# Patient Record
Sex: Female | Born: 1955
Health system: Southern US, Community
[De-identification: ages and names within clinical notes are randomized; demographics above are authoritative.]

## PROBLEM LIST (undated history)

## (undated) DIAGNOSIS — F329 Major depressive disorder, single episode, unspecified: Secondary | ICD-10-CM

## (undated) DIAGNOSIS — M7989 Other specified soft tissue disorders: Secondary | ICD-10-CM

## (undated) DIAGNOSIS — T7840XA Allergy, unspecified, initial encounter: Secondary | ICD-10-CM

## (undated) DIAGNOSIS — M1990 Unspecified osteoarthritis, unspecified site: Secondary | ICD-10-CM

## (undated) DIAGNOSIS — F419 Anxiety disorder, unspecified: Secondary | ICD-10-CM

## (undated) DIAGNOSIS — G4730 Sleep apnea, unspecified: Secondary | ICD-10-CM

## (undated) DIAGNOSIS — I38 Endocarditis, valve unspecified: Secondary | ICD-10-CM

## (undated) DIAGNOSIS — M25561 Pain in right knee: Secondary | ICD-10-CM

## (undated) DIAGNOSIS — E039 Hypothyroidism, unspecified: Secondary | ICD-10-CM

## (undated) DIAGNOSIS — I10 Essential (primary) hypertension: Secondary | ICD-10-CM

## (undated) DIAGNOSIS — R002 Palpitations: Secondary | ICD-10-CM

## (undated) DIAGNOSIS — F32A Depression, unspecified: Secondary | ICD-10-CM

## (undated) HISTORY — DX: Allergy, unspecified, initial encounter: T78.40XA

## (undated) HISTORY — DX: Essential (primary) hypertension: I10

## (undated) HISTORY — DX: Major depressive disorder, single episode, unspecified: F32.9

## (undated) HISTORY — DX: Endocarditis, valve unspecified: I38

## (undated) HISTORY — DX: Other specified soft tissue disorders: M79.89

## (undated) HISTORY — DX: Pain in right knee: M25.561

## (undated) HISTORY — DX: Anxiety disorder, unspecified: F41.9

## (undated) HISTORY — DX: Palpitations: R00.2

## (undated) HISTORY — DX: Hypothyroidism, unspecified: E03.9

## (undated) HISTORY — DX: Sleep apnea, unspecified: G47.30

## (undated) HISTORY — DX: Depression, unspecified: F32.A

## (undated) HISTORY — DX: Unspecified osteoarthritis, unspecified site: M19.90

---

## 2015-03-07 ENCOUNTER — Other Ambulatory Visit: Payer: Self-pay | Admitting: Family Medicine

## 2015-03-07 DIAGNOSIS — Z1231 Encounter for screening mammogram for malignant neoplasm of breast: Secondary | ICD-10-CM

## 2015-03-19 ENCOUNTER — Ambulatory Visit
Admission: RE | Admit: 2015-03-19 | Discharge: 2015-03-19 | Disposition: A | Discharge status: Home / Self Care | Payer: 59 | Source: Ambulatory Visit | Attending: Family Medicine | Admitting: Family Medicine

## 2015-03-19 DIAGNOSIS — Z1231 Encounter for screening mammogram for malignant neoplasm of breast: Secondary | ICD-10-CM

## 2016-03-02 DIAGNOSIS — Z23 Encounter for immunization: Secondary | ICD-10-CM | POA: Diagnosis not present

## 2016-03-02 DIAGNOSIS — I10 Essential (primary) hypertension: Secondary | ICD-10-CM | POA: Diagnosis not present

## 2016-03-02 DIAGNOSIS — E039 Hypothyroidism, unspecified: Secondary | ICD-10-CM | POA: Diagnosis not present

## 2016-04-08 DIAGNOSIS — Z0000 Encounter for general adult medical examination without abnormal findings: Secondary | ICD-10-CM | POA: Diagnosis not present

## 2016-04-08 DIAGNOSIS — R7301 Impaired fasting glucose: Secondary | ICD-10-CM | POA: Diagnosis not present

## 2016-04-16 ENCOUNTER — Other Ambulatory Visit: Payer: Self-pay | Admitting: Family Medicine

## 2016-04-16 DIAGNOSIS — Z1231 Encounter for screening mammogram for malignant neoplasm of breast: Secondary | ICD-10-CM

## 2016-04-16 DIAGNOSIS — Z0000 Encounter for general adult medical examination without abnormal findings: Secondary | ICD-10-CM | POA: Diagnosis not present

## 2016-04-16 DIAGNOSIS — Z01419 Encounter for gynecological examination (general) (routine) without abnormal findings: Secondary | ICD-10-CM | POA: Diagnosis not present

## 2016-05-05 ENCOUNTER — Ambulatory Visit
Admission: RE | Admit: 2016-05-05 | Discharge: 2016-05-05 | Disposition: A | Discharge status: Home / Self Care | Payer: 59 | Source: Ambulatory Visit | Attending: Family Medicine | Admitting: Family Medicine

## 2016-05-05 DIAGNOSIS — Z1231 Encounter for screening mammogram for malignant neoplasm of breast: Secondary | ICD-10-CM | POA: Diagnosis not present

## 2016-06-09 DIAGNOSIS — K5900 Constipation, unspecified: Secondary | ICD-10-CM | POA: Diagnosis not present

## 2016-06-09 DIAGNOSIS — K649 Unspecified hemorrhoids: Secondary | ICD-10-CM | POA: Diagnosis not present

## 2016-06-16 DIAGNOSIS — Z23 Encounter for immunization: Secondary | ICD-10-CM | POA: Diagnosis not present

## 2016-06-16 DIAGNOSIS — K648 Other hemorrhoids: Secondary | ICD-10-CM | POA: Diagnosis not present

## 2016-06-16 DIAGNOSIS — I10 Essential (primary) hypertension: Secondary | ICD-10-CM | POA: Diagnosis not present

## 2016-06-16 DIAGNOSIS — K5900 Constipation, unspecified: Secondary | ICD-10-CM | POA: Diagnosis not present

## 2016-06-25 ENCOUNTER — Encounter: Payer: Self-pay | Admitting: Gastroenterology

## 2016-07-06 DIAGNOSIS — K5900 Constipation, unspecified: Secondary | ICD-10-CM | POA: Diagnosis not present

## 2016-07-06 DIAGNOSIS — K649 Unspecified hemorrhoids: Secondary | ICD-10-CM | POA: Diagnosis not present

## 2016-07-09 ENCOUNTER — Encounter: Payer: Self-pay | Admitting: Nurse Practitioner

## 2016-07-09 ENCOUNTER — Ambulatory Visit (INDEPENDENT_AMBULATORY_CARE_PROVIDER_SITE_OTHER): Payer: 59 | Admitting: Nurse Practitioner

## 2016-07-09 VITALS — BP 124/68 | HR 115 | Ht 63.0 in | Wt 206.0 lb

## 2016-07-09 DIAGNOSIS — K602 Anal fissure, unspecified: Secondary | ICD-10-CM

## 2016-07-09 MED ORDER — LIDOCAINE HCL 2 % EX GEL: 1.0000 "application " | Freq: Three times a day (TID) | CUTANEOUS | 1 refills | Status: DC

## 2016-07-09 MED ORDER — DILTIAZEM GEL 2 %: 1.0000 "application " | Freq: Three times a day (TID) | CUTANEOUS | 1 refills | Status: DC

## 2016-07-09 NOTE — Progress Notes (Signed)
HPI: Patient is 61 year old female referred by PCP Maryelizabeth RowanElizabeth Dewey for evaluation of rectal pain. Patient gives a very long history of hemorrhoids but over the last 6 weeks she has developed severe rectal pain as well as bleeding with bowel movements. She isn't usually constipated but strained with a BM several weeks ago.  She used a week of hemorrhoidal cream which helped some.. No abdominal pain. She [place] in [place]. No other GI or general medical complaints   Past Medical History:  Diagnosis Date  . Hypertension   . Hypothyroidism     History reviewed. No pertinent surgical history. Family History  Problem Relation Age of Onset  . Diabetes Mother   . Colon cancer Maternal Uncle    Social History  Substance Use Topics  . Smoking status: Never Smoker  . Smokeless tobacco: Never Used  . Alcohol use 0.6 oz/week    1 Glasses of wine per week   Current Outpatient Prescriptions  Medication Sig Dispense Refill  . azelastine (OPTIVAR) 0.05 % ophthalmic solution 1 drop 2 (two) times daily.    . Azelastine HCl 0.15 % SOLN Place into the nose. 2 sprays intranasally 2 times per day in each nostril    . Cholecalciferol (VITAMIN D3) 5000 units CAPS Take 1 capsule by mouth daily.    Marland Kitchen. docusate sodium (COLACE) 100 MG capsule Take 100 mg by mouth 2 (two) times daily as needed for mild constipation.    . Estradiol (VAGIFEM) 10 MCG TABS vaginal tablet Place 1 tablet vaginally daily.    . fluticasone (VERAMYST) 27.5 MCG/SPRAY nasal spray Place 2 sprays into the nose daily.    Marland Kitchen. HYDROCORTISONE ACE, RECTAL, 30 MG SUPP Place rectally daily.    Marland Kitchen. levothyroxine (SYNTHROID, LEVOTHROID) 50 MCG tablet Take 50 mcg by mouth daily before breakfast.    . loratadine (CLARITIN) 10 MG tablet Take 10 mg by mouth daily.    Marland Kitchen. sulfacetamide-prednisolone (BLEPHAMIDE) 10-0.2 % ophthalmic suspension Place 1 drop into both eyes 4 (four) times daily.     No current  facility-administered medications for this visit.    Allergies  Allergen Reactions  . Latex   . Sulfa Antibiotics Swelling  . Penicillins Rash     Review of Systems: All systems reviewed and negative except where noted in HPI.    Physical Exam: BP 124/68 (BP Location: Right Arm, Patient Position: Sitting, Cuff Size: Normal)   Pulse (!) 115   Ht 5\' 3"  (1.6 m)   Wt 206 lb (93.4 kg)   BMI 36.49 kg/m  Constitutional:  Overweight white female in no acute distress. Psychiatric:very pleasant, normal mood and affect. Behavior is normal. EENT: Pupils normal.  Conjunctivae are normal. No scleral icterus. Neck supple.  Cardiovascular: Normal rate, regular rhythm. No edema Pulmonary/chest: Effort normal and breath sounds normal. No wheezing, rales or rhonchi. Abdominal: Soft, nondistended. Nontender. Bowel sounds active throughout. There are no masses palpable. No hepatomegaly RECTAL:  Fleshy hemorrhoidal tags. Fairly deep posterior midline fissure seen. DRE not attempted.  Lymphadenopathy: No cervical adenopathy noted. Neurological: Alert and oriented to person place and time. Skin: Skin is warm and dry. No rashes noted.   ASSESSMENT AND PLAN:  1. Pleasant 61 yo female with 6 week history of severe rectal pain and bleeding following and episode of straining (unusal for her). She has a fairly deep posterior midline fissure on exam.  -Diltiazem gel TID for 6-8 weeks -topical lidocaine TID prn -continue stool  softener. Use glycerin supp prn -follow up with me in 6-8 weeks, or sooner if not improving.  -Will request colonoscopy report from [place]  2. Colon cancer screening. She reports normal colonoscopy in 2014 in [place]. Will request results.  Will put her on colon recall list once result reviewed.   Willette Cluster, NP  07/09/2016, 11:33 AM  Cc:  Lewis Moccasin, MD

## 2016-07-09 NOTE — Patient Instructions (Addendum)
If you are age 62 or older, your body mass index should be bet14ween 23-30. Your Body mass index is 36.49 kg/m. If this is out of the aforementioned range listed, please consider follow up with your Primary Care Provider.  If you are age 62 or younger, your body mass index should be between 19-25. Your Body mass index is 36.49 kg/m. If this is out of the aformentioned range listed, please consider follow up with your Primary Care Provider.   We have sent the following medications to your pharmacy for you to pick up at your convenience: Diltiazem Gel Lidocaine Gel  Use Glycerin suppositories as needed.   How to Take a Sitz Bath A sitz bath is a warm water bath that is taken while you are sitting down. The water should only come up to your hips and should cover your buttocks. Your health care provider may recommend a sitz bath to help you:  Clean the lower part of your body, including your genital area.  With itching.  With pain.  With sore muscles or muscles that tighten or spasm.  How to take a sitz bath Take 3 sitz baths per day or as told by your health care provider. 1. Partially fill a bathtub with warm water. You will only need the water to be deep enough to cover your hips and buttocks when you are sitting in it. 2. If your health care provider told you to put medicine in the water, follow the directions exactly. 3. Sit in the water and open the tub drain a little. 4. Turn on the warm water again to keep the tub at the correct level. Keep the water running constantly. 5. Soak in the water for 15-20 minutes or as told by your health care provider. 6. After the sitz bath, pat the affected area dry first. Do not rub it. 7. Be careful when you stand up after the sitz bath because you may feel dizzy.  Contact a health care provider if:  Your symptoms get worse. Do not continue with sitz baths if your symptoms get worse.  You have new symptoms. Do not continue with sitz baths  until you talk with your health care provider. This information is not intended to replace advice given to you by your health care provider. Make sure you discuss any questions you have with your health care provider. Document Released: 09/14/2003 Document Revised: 05/22/2015 Document Reviewed: 12/20/2013 Elsevier Interactive Patient Education  [employer]2018 [employer].  Call with an update in three weeks.  Set up an appointment with Willette ClusterPaula Guenther, NP in 4-6 weeks.  Thank you for choosing me and [facility].   Willette ClusterPaula Guenther, NP

## 2016-07-13 NOTE — Progress Notes (Signed)
Agree with assessment and plan as outlined.  

## 2016-07-21 DIAGNOSIS — H43813 Vitreous degeneration, bilateral: Secondary | ICD-10-CM | POA: Diagnosis not present

## 2016-08-06 ENCOUNTER — Ambulatory Visit: Payer: 59 | Admitting: Gastroenterology

## 2016-08-18 ENCOUNTER — Encounter (INDEPENDENT_AMBULATORY_CARE_PROVIDER_SITE_OTHER): Payer: Self-pay

## 2016-08-18 ENCOUNTER — Encounter: Payer: Self-pay | Admitting: Nurse Practitioner

## 2016-08-18 ENCOUNTER — Ambulatory Visit (INDEPENDENT_AMBULATORY_CARE_PROVIDER_SITE_OTHER): Payer: 59 | Admitting: Nurse Practitioner

## 2016-08-18 VITALS — BP 140/60 | HR 100 | Ht 62.25 in | Wt 208.0 lb

## 2016-08-18 DIAGNOSIS — K602 Anal fissure, unspecified: Secondary | ICD-10-CM | POA: Diagnosis not present

## 2016-08-18 NOTE — Progress Notes (Signed)
     HPI: Patient is a 61 year old female who I saw approximately one month ago with severe rectal pain. A deep posterior midline fissure was found on exam. She is back for follow-up and has had great improvement with diltiazem gel. No rectal bleeding, able to sit without significant discomfort now. No significant pain with defecation. Her stools are soft, doesn't strain. Everything is going well. No abdominal pain. Leaving for vacation soon and a little nervous about managing bowels / fissure while away.    Past Medical History:  Diagnosis Date  . Hypertension   . Hypothyroidism     Patient's surgical history, family medical history, social history, medications and allergies were all reviewed in Epic    Physical Exam: BP 140/60 (BP Location: Left Arm, Patient Position: Sitting, Cuff Size: Normal)   Pulse 100   Ht 5' 2.25" (1.581 m) Comment: height measured without shoes  Wt 208 lb (94.3 kg)   BMI 37.74 kg/m   GENERAL: well developed white female in NAD PSYCH: :Pleasant, cooperative, normal affect NEURO: Alert and oriented x 3, no focal neurologic deficits Rectal: posterior midline fissure filling in with granulation tissue   ASSESSMENT and PLAN:  1. Pleasant 61 year old female with deep posterior midline fissure. Healing nicely on 5th week of diltiazem gel.  -continue Diltiazem gel TID for another 3-4 weeks. Refill given.  -follow up with me in 4 weeks -keep stools soft, do not strain to have a BM. Takes stool softeners. She will keep glycerin supp on hand in case of constipation while on vacation.   2. Colon cancer screening. Received colonoscopy report requested at time of her last visit. [facility]. The exam was complete with a good prep. Multiple small mouth diverticula were found in the sigmoid colon. Internal hemorrhoids seen. Exam otherwise negative. Follow-up colonoscopy recommended at 10 years which would put her at  April 2024   Willette ClusterPaula Guenther , NP 08/18/2016, 11:23 AM

## 2016-08-18 NOTE — Patient Instructions (Addendum)
If you are age 62 or older, your body mass index should be between 23-30. Your Body mass index is 37.74 kg/m. If this is out of the aforementioned range listed, please consider follow up with your Primary Care Provider.  If you are age 62 or younger, your body mass index should be between 19-25. Your Body mass index is 37.74 kg/m. If this is out of the aformentioned range listed, please consider follow up with your Primary Care Provider.   Continue anal fissure treatment.  Follow up in four weeks with Willette ClusterPaula Guenther, NP. Please call the office to set up an appointment in four weeks as the schedule is not out.  Thank you for choosing me and  Gastro[facility].   Willette ClusterPaula Guenther, NP

## 2016-08-19 NOTE — Progress Notes (Signed)
Agree with assessment and plan as outlined.  

## 2016-10-16 ENCOUNTER — Ambulatory Visit (INDEPENDENT_AMBULATORY_CARE_PROVIDER_SITE_OTHER): Payer: 59 | Admitting: Family Medicine

## 2016-10-16 ENCOUNTER — Encounter: Payer: Self-pay | Admitting: Family Medicine

## 2016-10-16 VITALS — BP 136/82 | HR 110 | Temp 98.5°F | Ht 62.25 in | Wt 210.4 lb

## 2016-10-16 DIAGNOSIS — E039 Hypothyroidism, unspecified: Secondary | ICD-10-CM | POA: Diagnosis not present

## 2016-10-16 DIAGNOSIS — E669 Obesity, unspecified: Secondary | ICD-10-CM

## 2016-10-16 DIAGNOSIS — I10 Essential (primary) hypertension: Secondary | ICD-10-CM

## 2016-10-16 DIAGNOSIS — Z23 Encounter for immunization: Secondary | ICD-10-CM | POA: Diagnosis not present

## 2016-10-16 DIAGNOSIS — Z0000 Encounter for general adult medical examination without abnormal findings: Secondary | ICD-10-CM

## 2016-10-16 MED ORDER — LISINOPRIL 10 MG PO TABS: 10.0000 mg | ORAL_TABLET | Freq: Every day | ORAL | 3 refills | Status: DC

## 2016-10-16 NOTE — Patient Instructions (Signed)
Look at [place].

## 2016-10-16 NOTE — Progress Notes (Signed)
Kimberly Mcguire is a 61 y.o. female is here to [facility] CARE.   Patient Care Team: [name], DO as PCP - General (Family Medicine)   History of Present Illness:   Kimberly Mcguire, CMA, acting as scribe for Dr. Earlene Plater.  HPI:   1. Routine physical examination.  The patient is here to establish care. She reports no new symptoms today.    2. Acquired hypothyroidism. Long history of hypothyroidism for which she takes levothyroxine. He is due for a recheck of her hands. She denies overt fatigue, but does endorse some weight gain.    3. Essential hypertension.  Currently controlled with lisinopril. She denies any cough or other side effects. She does not check her blood pressure regularly. Due for labs.    4. Need for immunization against influenza. She agrees to the flu shot today.    5. Obesity (BMI 30-39.9). Steady weight gain per the patient. She would like to see if her thyroid has anything to do with this. She does exercise but not regularly. She follows no restrictive diets.    Health Maintenance Due  Topic Date Due  . Hepatitis C Screening  05-Sep-1955  . HIV Screening  03/01/1969   Depression screen PHQ 2/9 10/16/2016  Decreased Interest 0  Down, Depressed, Hopeless 0  PHQ - 2 Score 0   PMHx, SurgHx, SocialHx, Medications, and Allergies were reviewed in the Visit Navigator and updated as appropriate.   Past Medical History:  Diagnosis Date  . Hypertension   . Hypothyroidism    History reviewed. No pertinent surgical history.  Family History  Problem Relation Age of Onset  . Diabetes Mother   . Colon cancer Maternal Uncle    Social History  Substance Use Topics  . Smoking status: Never Smoker  . Smokeless tobacco: Never Used  . Alcohol use 0.6 oz/week    1 Glasses of wine per week   Current Medications and Allergies:   .  Azelastine HCl 0.15 % SOLN, Place into the nose. 2 sprays intranasally 2 times per day in each nostril, Disp: , Rfl:  .  Cholecalciferol  (VITAMIN D3) 5000 units CAPS, Take 1 capsule by mouth daily., Disp: , Rfl:  .  docusate sodium (COLACE) 100 MG capsule, Take 100 mg by mouth 2 (two) times daily as needed for mild constipation., Disp: , Rfl:  .  Estradiol (VAGIFEM) 10 MCG TABS vaginal tablet, Place 1 tablet vaginally once a week. , Disp: , Rfl:  .  fluticasone (VERAMYST) 27.5 MCG/SPRAY nasal spray, Place 2 sprays into the nose daily., Disp: , Rfl:  .  levothyroxine (SYNTHROID, LEVOTHROID) 50 MCG tablet, Take 50 mcg by mouth daily before breakfast., Disp: , Rfl:  .  loratadine (CLARITIN) 10 MG tablet, Take 10 mg by mouth daily., Disp: , Rfl:  .  lisinopril (PRINIVIL) 10 MG tablet, Take 1 tablet (10 mg total) by mouth daily., Disp: 90 tablet, Rfl: 3  Allergies  Allergen Reactions  . Latex   . Sulfa Antibiotics Swelling  . Penicillins Rash   Review of Systems:   Pertinent items are noted in the HPI. Otherwise, ROS is negative.  Vitals:   Vitals:   10/16/16 1406  BP: 136/82  Pulse: (!) 110  Temp: 98.5 F (36.9 C)  TempSrc: Oral  SpO2: 98%  Weight: 210 lb 6.4 oz (95.4 kg)  Height: 5' 2.25" (1.581 m)     Body mass index is 38.17 kg/m.   Physical Exam:   Physical Exam  Constitutional: She is oriented to person, place, and time. She appears well-developed and well-nourished. No distress.  HENT:  Head: Normocephalic and atraumatic.  Right Ear: External ear normal.  Left Ear: External ear normal.  Nose: Nose normal.  Mouth/Throat: Oropharynx is clear and moist.  Eyes: Pupils are equal, round, and reactive to light. Conjunctivae and EOM are normal.  Neck: Normal range of motion. Neck supple. No thyromegaly present.  Cardiovascular: Normal rate, regular rhythm, normal heart sounds and intact distal pulses.   Pulmonary/Chest: Effort normal and breath sounds normal.  Abdominal: Soft. Bowel sounds are normal.  Musculoskeletal: Normal range of motion.  Lymphadenopathy:    She has no cervical adenopathy.    Neurological: She is alert and oriented to person, place, and time.  Skin: Skin is warm and dry. Capillary refill takes less than 2 seconds.  Psychiatric: She has a normal mood and affect. Her behavior is normal.  Nursing note and vitals reviewed.  Assessment and Plan:   Kimberly Mcguire was seen today for establish care.  Diagnoses and all orders for this visit:  Routine physical examination  Acquired hypothyroidism -     TSH; Future -     T4, free; Future  Essential hypertension -     lisinopril (PRINIVIL) 10 MG tablet; Take 1 tablet (10 mg total) by mouth daily. -     Comprehensive metabolic panel; Future  Need for immunization against influenza -     Flu Vaccine QUAD 36+ mos IM  Obesity (BMI 30-39.9) Comments: The patient is asked to make an attempt to improve diet and exercise patterns to aid in medical management of this problem.  Orders: -     Insulin, Free (Bioactive); Future  . Reviewed expectations re: course of current medical issues. . Discussed self-management of symptoms. . Outlined signs and symptoms indicating need for more acute intervention. . Patient verbalized understanding and all questions were answered. Marland Kitchen Health Maintenance issues including appropriate healthy diet, exercise, and smoking avoidance were discussed with patient. . See orders for this visit as documented in the electronic medical record. . Patient received an After Visit Summary.  Patient Counseling:    Nutrition: Stressed importance of moderation in sodium/caffeine intake, saturated fat and cholesterol, caloric balance, sufficient intake of fresh fruits, vegetables, fiber, calcium, iron, and 1 mg of folate supplement per day (for females capable of pregnancy).     Stressed the importance of regular exercise.      Substance Abuse: Discussed cessation/primary prevention of tobacco, alcohol, or other drug use; driving or other dangerous activities under the influence; availability of  treatment for abuse.      Injury prevention: Discussed safety belts, safety helmets, smoke detector, smoking near bedding or upholstery.      Sexuality: Discussed sexually transmitted diseases, partner selection, use of condoms, avoidance of unintended pregnancy  and contraceptive alternatives.     Dental health: Discussed importance of regular tooth brushing, flossing, and dental visits.     Health maintenance and immunizations reviewed. Please refer to Health maintenance section.   CMA served as Neurosurgeon during this visit. History, Physical, and Plan performed by medical provider. The above documentation has been reviewed and is accurate and complete. [name], D.O.  [name], DO [facility] 10/18/2016  Future Appointments Date Time Provider Department Center  10/19/2016 10:00 AM Meredith Pel, NP [facility]  10/20/2016 9:15 AM [facility] None

## 2016-10-18 DIAGNOSIS — E039 Hypothyroidism, unspecified: Secondary | ICD-10-CM | POA: Insufficient documentation

## 2016-10-18 DIAGNOSIS — E669 Obesity, unspecified: Secondary | ICD-10-CM | POA: Insufficient documentation

## 2016-10-18 DIAGNOSIS — Z780 Asymptomatic menopausal state: Secondary | ICD-10-CM | POA: Insufficient documentation

## 2016-10-18 DIAGNOSIS — I10 Essential (primary) hypertension: Secondary | ICD-10-CM | POA: Insufficient documentation

## 2016-10-19 ENCOUNTER — Encounter: Payer: Self-pay | Admitting: Nurse Practitioner

## 2016-10-19 ENCOUNTER — Ambulatory Visit (INDEPENDENT_AMBULATORY_CARE_PROVIDER_SITE_OTHER): Payer: 59 | Admitting: Nurse Practitioner

## 2016-10-19 VITALS — BP 122/72 | HR 80 | Ht 62.0 in | Wt 209.0 lb

## 2016-10-19 DIAGNOSIS — K602 Anal fissure, unspecified: Secondary | ICD-10-CM

## 2016-10-19 NOTE — Patient Instructions (Signed)
If you are age 62 or older, your body mass index should be between 23-30. Your Body mass index is 38.23 kg/m. If this is out of the aforementioned range listed, please consider follow up with your Primary Care Provider.  If you are age 50 or younger, your body mass index should be between 19-25. Your Body mass index is 38.23 kg/m. If this is out of the aformentioned range listed, please consider follow up with your Primary Care Provider.   Follow up as needed.  Thank you for choosing me and [facility].   Willette Cluster, NP

## 2016-10-19 NOTE — Progress Notes (Signed)
     Chief Complaint:  Follow up on anal fissure    HPI: Patient is a 61 yo female who I saw in July for severe anal pain and found a fissure on exam. Started Diltizem gel and she had significantly improved on follow up mid August. She is back today for a final follow up. She has no anorectal pain. No rectal bleeding. Her bowels are moving well on a high fiber diet. She asks about adding Mg+ to be sure she never get constipated.    Past Medical History:  Diagnosis Date  . Hypertension   . Hypothyroidism     Patient's surgical history, family medical history, social history, medications and allergies were all reviewed in Epic    Physical Exam: BP 122/72   Pulse 80   Ht  (1.575 m)   Wt 209 lb (94.8 kg)   BMI 38.23 kg/m   GENERAL:  Pleasant white female in NAD PSYCH: :Pleasant, cooperative, normal affect PULM: Normal respiratory effort NEURO: Alert and oriented x 3, no focal neurologic deficits Rectal : no external lesions seen. Fissure healed   ASSESSMENT and PLAN:  1. Pleasant 61 year old female with recent anal fissure, resolved with Diltiazem.  -Patient eats a diet high in fiber, she has never really had problems with constipation but inquires about additng magnesium to be sure that she never does have problems.  I advised stool softeners and continuation of high fiber diet instead of adding magnesium at this point. She now knows to avoid straining and has used a glycerin supp a couple of times which worked well.  -follow up prn.   2. Colon cancer screening. [place].Records reviewed at last visit, due again 2024  Willette Cluster , NP 10/19/2016, 10:15 AM

## 2016-10-20 ENCOUNTER — Other Ambulatory Visit (INDEPENDENT_AMBULATORY_CARE_PROVIDER_SITE_OTHER): Payer: 59

## 2016-10-20 DIAGNOSIS — E669 Obesity, unspecified: Secondary | ICD-10-CM

## 2016-10-20 DIAGNOSIS — E039 Hypothyroidism, unspecified: Secondary | ICD-10-CM

## 2016-10-20 DIAGNOSIS — I10 Essential (primary) hypertension: Secondary | ICD-10-CM

## 2016-10-20 LAB — TSH: TSH: 2.34 u[IU]/mL (ref 0.35–4.50)

## 2016-10-20 LAB — COMPREHENSIVE METABOLIC PANEL
ALT: 24 U/L (ref 0–35)
AST: 16 U/L (ref 0–37)
Albumin: 4.5 g/dL (ref 3.5–5.2)
Alkaline Phosphatase: 46 U/L (ref 39–117)
BUN: 16 mg/dL (ref 6–23)
CO2: 28 mEq/L (ref 19–32)
Calcium: 10.1 mg/dL (ref 8.4–10.5)
Chloride: 100 mEq/L (ref 96–112)
Creatinine, Ser: 0.67 mg/dL (ref 0.40–1.20)
GFR: 94.6 mL/min (ref >60.00)
Glucose, Bld: 103 mg/dL — ABNORMAL HIGH (ref 70–99)
Potassium: 4.8 mEq/L (ref 3.5–5.1)
Sodium: 139 mEq/L (ref 135–145)
Total Bilirubin: 0.6 mg/dL (ref 0.2–1.2)
Total Protein: 7.1 g/dL (ref 6.0–8.3)

## 2016-10-20 LAB — T4, FREE: Free T4: 0.93 ng/dL (ref 0.60–1.60)

## 2016-10-20 NOTE — Progress Notes (Signed)
Agree with assessment and plan as outlined.  

## 2016-10-23 LAB — INSULIN, FREE (BIOACTIVE): Insulin, Free: 10.5 u[IU]/mL (ref 1.5–14.9)

## 2017-01-07 ENCOUNTER — Other Ambulatory Visit: Payer: Self-pay | Admitting: Family Medicine

## 2017-01-08 NOTE — Telephone Encounter (Signed)
Please advise on refill.

## 2017-01-18 ENCOUNTER — Ambulatory Visit: Payer: 59 | Admitting: Family Medicine

## 2017-01-18 DIAGNOSIS — Z0289 Encounter for other administrative examinations: Secondary | ICD-10-CM

## 2017-01-21 ENCOUNTER — Other Ambulatory Visit: Payer: Self-pay

## 2017-01-21 MED ORDER — AZELASTINE HCL 0.15 % NA SOLN: 2.0000 | Freq: Two times a day (BID) | NASAL | 3 refills | Status: DC

## 2017-02-15 ENCOUNTER — Encounter: Payer: Self-pay | Admitting: Family Medicine

## 2017-02-26 DIAGNOSIS — L600 Ingrowing nail: Secondary | ICD-10-CM | POA: Diagnosis not present

## 2017-02-26 DIAGNOSIS — M25775 Osteophyte, left foot: Secondary | ICD-10-CM | POA: Diagnosis not present

## 2017-04-19 ENCOUNTER — Ambulatory Visit (INDEPENDENT_AMBULATORY_CARE_PROVIDER_SITE_OTHER): Payer: 59 | Admitting: Family Medicine

## 2017-04-19 ENCOUNTER — Other Ambulatory Visit: Payer: Self-pay | Admitting: Family Medicine

## 2017-04-19 ENCOUNTER — Encounter: Payer: Self-pay | Admitting: Family Medicine

## 2017-04-19 VITALS — BP 134/80 | HR 90 | Temp 98.3°F | Ht 62.0 in | Wt 213.8 lb

## 2017-04-19 DIAGNOSIS — I10 Essential (primary) hypertension: Secondary | ICD-10-CM

## 2017-04-19 DIAGNOSIS — E039 Hypothyroidism, unspecified: Secondary | ICD-10-CM | POA: Diagnosis not present

## 2017-04-19 DIAGNOSIS — R5381 Other malaise: Secondary | ICD-10-CM

## 2017-04-19 DIAGNOSIS — Z1159 Encounter for screening for other viral diseases: Secondary | ICD-10-CM | POA: Diagnosis not present

## 2017-04-19 DIAGNOSIS — R5383 Other fatigue: Secondary | ICD-10-CM | POA: Diagnosis not present

## 2017-04-19 DIAGNOSIS — Z114 Encounter for screening for human immunodeficiency virus [HIV]: Secondary | ICD-10-CM

## 2017-04-19 DIAGNOSIS — E559 Vitamin D deficiency, unspecified: Secondary | ICD-10-CM | POA: Diagnosis not present

## 2017-04-19 LAB — CBC WITH DIFFERENTIAL/PLATELET
Basophils Absolute: 0 10*3/uL (ref 0.0–0.1)
Basophils Relative: 1 % (ref 0.0–3.0)
Eosinophils Absolute: 0.1 10*3/uL (ref 0.0–0.7)
Eosinophils Relative: 2.7 % (ref 0.0–5.0)
HCT: 42.7 % (ref 36.0–46.0)
Hemoglobin: 14.9 g/dL (ref 12.0–15.0)
Lymphocytes Relative: 37.8 % (ref 12.0–46.0)
Lymphs Abs: 2 10*3/uL (ref 0.7–4.0)
MCHC: 34.9 g/dL (ref 30.0–36.0)
MCV: 97.2 fl (ref 78.0–100.0)
Monocytes Absolute: 0.4 10*3/uL (ref 0.1–1.0)
Monocytes Relative: 7.2 % (ref 3.0–12.0)
Neutro Abs: 2.7 10*3/uL (ref 1.4–7.7)
Neutrophils Relative %: 51.3 % (ref 43.0–77.0)
Platelets: 244 10*3/uL (ref 150.0–400.0)
RBC: 4.39 Mil/uL (ref 3.87–5.11)
RDW: 13.1 % (ref 11.5–15.5)
WBC: 5.2 10*3/uL (ref 4.0–10.5)

## 2017-04-19 LAB — VITAMIN D 25 HYDROXY (VIT D DEFICIENCY, FRACTURES): VITD: 42.56 ng/mL (ref 30.00–100.00)

## 2017-04-19 LAB — COMPREHENSIVE METABOLIC PANEL
ALT: 27 U/L (ref 0–35)
AST: 17 U/L (ref 0–37)
Albumin: 4.5 g/dL (ref 3.5–5.2)
Alkaline Phosphatase: 47 U/L (ref 39–117)
BUN: 15 mg/dL (ref 6–23)
CO2: 29 mEq/L (ref 19–32)
Calcium: 9.8 mg/dL (ref 8.4–10.5)
Chloride: 101 mEq/L (ref 96–112)
Creatinine, Ser: 0.56 mg/dL (ref 0.40–1.20)
GFR: 116.16 mL/min (ref >60.00)
Glucose, Bld: 96 mg/dL (ref 70–99)
Potassium: 4.3 mEq/L (ref 3.5–5.1)
Sodium: 137 mEq/L (ref 135–145)
Total Bilirubin: 0.7 mg/dL (ref 0.2–1.2)
Total Protein: 7.1 g/dL (ref 6.0–8.3)

## 2017-04-19 LAB — T4, FREE: Free T4: 0.92 ng/dL (ref 0.60–1.60)

## 2017-04-19 LAB — TSH: TSH: 2.38 u[IU]/mL (ref 0.35–4.50)

## 2017-04-19 LAB — T3, FREE: T3, Free: 3.5 pg/mL (ref 2.3–4.2)

## 2017-04-19 MED ORDER — LISINOPRIL 10 MG PO TABS: 10.0000 mg | ORAL_TABLET | Freq: Every day | ORAL | 3 refills | Status: DC

## 2017-04-19 MED ORDER — LEVOTHYROXINE SODIUM 50 MCG PO TABS: 50.0000 ug | ORAL_TABLET | Freq: Every day | ORAL | 1 refills | Status: DC

## 2017-04-19 NOTE — Progress Notes (Signed)
Kimberly ModeJoanne Stitely is a 62 y.o. female is here for follow up.  History of Present Illness:   HPI:   1. Acquired hypothyroidism.  Due for recheck.  Patient denies any overt malaise or weight changes.  She does not complain of any hair loss or dry skin.   2. Essential hypertension.  Compliant with treatment.  No concerns.  Denies chest pain, shortness of breath, edema.   3. Vitamin D deficiency.  Taking vitamin D 5000 international units daily.  Due for recheck.   4. Malaise and fatigue.  Daily.  She also endorses not sleeping very well.  She does wake multiple times per night and has been told by her husband that she makes funny noises.  She would be open to a sleep study.   Health Maintenance Due  Topic Date Due  . Hepatitis C Screening  1955-08-01  . HIV Screening  03/01/1969   [facility] 2/9 04/19/2017 10/16/2016  Decreased Interest 0 0  Down, Depressed, Hopeless 0 0  PHQ - 2 Score 0 0  Altered sleeping 1 -  Tired, decreased energy 1 -  Change in appetite 0 -  Feeling bad or failure about yourself  0 -  Trouble concentrating 0 -  Moving slowly or fidgety/restless 0 -  Suicidal thoughts 0 -  PHQ-9 Score 2 -  Difficult doing work/chores Not difficult at all -   PMHx, SurgHx, SocialHx, FamHx, Medications, and Allergies were reviewed in the Visit Navigator and updated as appropriate.   Patient Active Problem List   Diagnosis Date Noted  . Postmenopausal 10/18/2016  . Obesity (BMI 30-39.9) 10/18/2016  . Essential hypertension 10/18/2016  . Acquired hypothyroidism 10/18/2016   Social History   Tobacco Use  . Smoking status: Never Smoker  . Smokeless tobacco: Never Used  Substance Use Topics  . Alcohol use: Yes    Alcohol/week: 0.6 oz    Types: 1 Glasses of wine per week  . Drug use: No   Current Medications and Allergies:   Current Outpatient Medications:  .  acyclovir ointment (ZOVIRAX) 5 %, Apply 1 application topically every 3 (three) hours., Disp: ,  Rfl:  .  Cholecalciferol (VITAMIN D3) 5000 units CAPS, Take 1 capsule by mouth daily., Disp: , Rfl:  .  Estradiol (VAGIFEM) 10 MCG TABS vaginal tablet, Place 1 tablet vaginally once a week. , Disp: , Rfl:  .  fluticasone (VERAMYST) 27.5 MCG/SPRAY nasal spray, Place 2 sprays into the nose daily., Disp: , Rfl:  .  levothyroxine (SYNTHROID, LEVOTHROID) 50 MCG tablet, Take 1 tablet (50 mcg total) by mouth daily before breakfast., Disp: 90 tablet, Rfl: 1 .  lisinopril (PRINIVIL) 10 MG tablet, Take 1 tablet (10 mg total) by mouth daily., Disp: 90 tablet, Rfl: 3 .  loratadine (CLARITIN) 10 MG tablet, Take 10 mg by mouth daily., Disp: , Rfl:  .  Azelastine HCl 0.15 % SOLN, PLACE 2 SPRAYS INTO THE NOSE 2 TIMES DAILY., Disp: 30 mL, Rfl: 6   Allergies  Allergen Reactions  . Latex   . Sulfa Antibiotics Swelling  . Penicillins Rash   Review of Systems   Pertinent items are noted in the HPI. Otherwise, ROS is negative.  Vitals:   Vitals:   04/19/17 1002  BP: 134/80  Pulse: 90  Temp: 98.3 F (36.8 C)  TempSrc: Oral  SpO2: 90%  Weight: 213 lb 12.8 oz (97 kg)  Height: 5\' 2"  (1.575 m)     Body mass index is 39.1  kg/m.   Physical Exam:   Physical Exam  Constitutional: She is oriented to person, place, and time. She appears well-developed and well-nourished. No distress.  HENT:  Head: Normocephalic and atraumatic.  Right Ear: External ear normal.  Left Ear: External ear normal.  Nose: Nose normal.  Mouth/Throat: Oropharynx is clear and moist.  Eyes: Pupils are equal, round, and reactive to light. Conjunctivae and EOM are normal.  Neck: Normal range of motion. Neck supple. No thyromegaly present.  Cardiovascular: Normal rate, regular rhythm, normal heart sounds and intact distal pulses.  Pulmonary/Chest: Effort normal and breath sounds normal.  Abdominal: Soft. Bowel sounds are normal.  Musculoskeletal: Normal range of motion.  Lymphadenopathy:    She has no cervical adenopathy.    Neurological: She is alert and oriented to person, place, and time.  Skin: Skin is warm and dry. Capillary refill takes less than 2 seconds.  Psychiatric: She has a normal mood and affect. Her behavior is normal.  Nursing note and vitals reviewed.   Assessment and Plan:   Hildagarde was seen today for annual exam.  Diagnoses and all orders for this visit:  Acquired hypothyroidism -     T3, free -     TSH -     T4, free  Essential hypertension -     lisinopril (PRINIVIL) 10 MG tablet; Take 1 tablet (10 mg total) by mouth daily. -     CBC with Differential/Platelet -     Comprehensive metabolic panel  Encounter for screening for HIV -     HIV antibody  Need for hepatitis C screening test -     Hepatitis C antibody  Vitamin D deficiency -     VITAMIN D 25 Hydroxy (Vit-D Deficiency, Fractures)  Malaise and fatigue -     Home sleep test  Other orders -     levothyroxine (SYNTHROID, LEVOTHROID) 50 MCG tablet; Take 1 tablet (50 mcg total) by mouth daily before breakfast.    . Reviewed expectations re: course of current medical issues. . Discussed self-management of symptoms. . Outlined signs and symptoms indicating need for more acute intervention. . Patient verbalized understanding and all questions were answered. Marland Kitchen Health Maintenance issues including appropriate healthy diet, exercise, and smoking avoidance were discussed with patient. . See orders for this visit as documented in the electronic medical record. . Patient received an After Visit Summary.  [name], DO [place] 04/19/2017  No future appointments.

## 2017-04-20 LAB — HEPATITIS C ANTIBODY
Hepatitis C Ab: NONREACTIVE
SIGNAL TO CUT-OFF: 0.02 (ref <1.00)

## 2017-04-20 LAB — HIV ANTIBODY (ROUTINE TESTING W REFLEX): HIV 1&2 Ab, 4th Generation: NONREACTIVE

## 2017-04-21 ENCOUNTER — Encounter: Payer: Self-pay | Admitting: Family Medicine

## 2017-04-21 DIAGNOSIS — E669 Obesity, unspecified: Secondary | ICD-10-CM

## 2017-06-03 ENCOUNTER — Ambulatory Visit: Payer: 59 | Admitting: Sports Medicine

## 2017-06-03 ENCOUNTER — Ambulatory Visit (INDEPENDENT_AMBULATORY_CARE_PROVIDER_SITE_OTHER): Payer: 59

## 2017-06-03 ENCOUNTER — Other Ambulatory Visit: Payer: Self-pay

## 2017-06-03 ENCOUNTER — Ambulatory Visit: Payer: Self-pay

## 2017-06-03 ENCOUNTER — Telehealth: Payer: Self-pay | Admitting: Family Medicine

## 2017-06-03 ENCOUNTER — Encounter: Payer: Self-pay | Admitting: Sports Medicine

## 2017-06-03 VITALS — BP 130/84 | HR 99 | Ht 62.0 in | Wt 211.6 lb

## 2017-06-03 DIAGNOSIS — M1711 Unilateral primary osteoarthritis, right knee: Secondary | ICD-10-CM

## 2017-06-03 DIAGNOSIS — Z72820 Sleep deprivation: Secondary | ICD-10-CM

## 2017-06-03 DIAGNOSIS — M25561 Pain in right knee: Secondary | ICD-10-CM | POA: Diagnosis not present

## 2017-06-03 NOTE — Procedures (Signed)
PROCEDURE NOTE:  Ultrasound Guided: Injection: Right knee Images were obtained and interpreted by myself, Gaspar Bidding, DO  Images have been saved and stored to PACS system. Images obtained on: GE S7 Ultrasound machine    ULTRASOUND FINDINGS:  Small amount of synovitis.  Minimal effusion  DESCRIPTION OF PROCEDURE:  The patient's clinical condition is marked by substantial pain and/or significant functional disability. Other conservative therapy has not provided relief, is contraindicated, or not appropriate. There is a reasonable likelihood that injection will significantly improve the patient's pain and/or functional impairment.   After discussing the risks, benefits and expected outcomes of the injection and all questions were reviewed and answered, the patient wished to undergo the above named procedure.  Verbal consent was obtained.  The ultrasound was used to identify the target structure and adjacent neurovascular structures. The skin was then prepped in sterile fashion and the target structure was injected under direct visualization using sterile technique as below:  PREP: Alcohol and Ethel Chloride APPROACH: superiolateral, single injection, 25g 1.5 in. INJECTATE: 2 cc 0.5% Marcaine and 2 cc /mL DepoMedrol ASPIRATE: None DRESSING: Band-Aid  Post procedural instructions including recommending icing and warning signs for infection were reviewed.    This procedure was well tolerated and there were no complications.   IMPRESSION: Succesful Ultrasound Guided: Injection

## 2017-06-03 NOTE — Progress Notes (Signed)
[name]. [name] [phone]  Kimberly Mcguire - 62 y.o. female MRN [id number]  Date of birth: September 07, 1955  Visit Date: 06/03/2017  PCP: [name], DO   Referred by: [name], DO  Scribe for today's visit: [name], CMA     SUBJECTIVE:  Kimberly Mcguire is here for Initial Assessment (R knee pain)  Her R knee pain symptoms INITIALLY: Began 09/2016 but then flared up again 04/2017. In 09/2016 she pivoted over a baby gate and that's when the pain started. In April the pain flared up again after climbing up and down a ladder.  Described as moderate-severe aching, non-radiating. Worsened with weight bearing, up and down stairs.  Improved with rest. Additional associated symptoms include: Sx are worse at night. Pain is mostly on the medial aspect of the knee. The knee is tender to palpation. There is swelling present. She has noticed clicking and popping in the knee. Yesterday she had jury duty and sitting in the chair for that long period of time caused a lot of pressure and increased pain. She was helping a family member move recently and also noticed some swelling at the lateral aspect of the R ankle.     At this time symptoms show no change compared to onset. Pain comes and goes depending on activity level.  She has been taking IBU with some relief. She has also tried icing the knee with some relief. She tried OTC compression sleeve but it kept rolling down her leg. She has also tried wearing a band under her knee with some relief.    ROS Denies night time disturbances. Denies fevers, chills, or night sweats. Denies unexplained weight loss. Denies personal history of cancer. Denies changes in bowel or bladder habits. Denies recent unreported falls. Denies new or worsening dyspnea or wheezing. Denies headaches or dizziness.  Denies numbness, tingling or weakness  In the extremities.  Denies dizziness or  presyncopal episodes Reports lower extremity edema    HISTORY & PERTINENT PRIOR DATA:  Prior History reviewed and updated per electronic medical record.  Significant/pertinent history, findings, studies include:  reports that she has never smoked. She has never used smokeless tobacco. No results for input(s): HGBA1C, LABURIC, CREATINE in the last 8760 hours. No specialty comments available. No problems updated.  OBJECTIVE:  VS:  HT:5\' 2"  (157.5 cm)   WT:211 lb 9.6 oz (96 kg)  BMI:38.69    BP:130/84  HR:99bpm  TEMP: ( )  RESP:95 %   PHYSICAL EXAM: Constitutional: WDWN, Non-toxic appearing. Psychiatric: Alert & appropriately interactive.  Not depressed or anxious appearing. Respiratory: No increased work of breathing.  Trachea Midline Eyes: Pupils are equal.  EOM intact without nystagmus.  No scleral icterus  Vascular Exam: warm to touch no edema  lower extremity neuro exam: unremarkable  MSK Exam: Medial joint line pain.  Pain with McMurray's.  Full flexion extension.  Small amount of synovitis.  Extensor mechanism intact.   ASSESSMENT & PLAN:   1. Right knee pain, unspecified chronicity   2. Primary osteoarthritis of right knee     PLAN: Right knee injections today.  Avoid exacerbating activities.  Follow-up: Return in about 6 weeks (around 07/15/2017).      Please see additional documentation for Objective, Assessment and Plan sections. Pertinent additional documentation may be included in corresponding procedure notes, imaging studies, problem based documentation and patient instructions. Please see these sections of the encounter for additional information regarding  this visit.  CMA/ATC served as Neurosurgeon during this visit. History, Physical, and Plan performed by medical provider. Documentation and orders reviewed and attested to.      [name], DO    [place] [facility] Physician

## 2017-06-03 NOTE — Patient Instructions (Signed)

## 2017-06-03 NOTE — Telephone Encounter (Signed)
Error

## 2017-06-08 ENCOUNTER — Other Ambulatory Visit (INDEPENDENT_AMBULATORY_CARE_PROVIDER_SITE_OTHER): Payer: 59

## 2017-06-08 DIAGNOSIS — E669 Obesity, unspecified: Secondary | ICD-10-CM

## 2017-06-08 LAB — LIPID PANEL
Cholesterol: 178 mg/dL (ref 0–200)
HDL: 62 mg/dL (ref >39.00)
LDL Cholesterol: 88 mg/dL (ref 0–99)
NonHDL: 115.97
Total CHOL/HDL Ratio: 3
Triglycerides: 142 mg/dL (ref 0.0–149.0)
VLDL: 28.4 mg/dL (ref 0.0–40.0)

## 2017-07-15 ENCOUNTER — Ambulatory Visit: Payer: 59 | Admitting: Sports Medicine

## 2017-07-15 ENCOUNTER — Encounter: Payer: Self-pay | Admitting: Sports Medicine

## 2017-07-15 VITALS — BP 130/86 | HR 90 | Ht 62.0 in | Wt 212.4 lb

## 2017-07-15 DIAGNOSIS — M1711 Unilateral primary osteoarthritis, right knee: Secondary | ICD-10-CM

## 2017-07-15 DIAGNOSIS — R269 Unspecified abnormalities of gait and mobility: Secondary | ICD-10-CM | POA: Diagnosis not present

## 2017-07-15 DIAGNOSIS — M25561 Pain in right knee: Secondary | ICD-10-CM | POA: Diagnosis not present

## 2017-07-15 NOTE — Patient Instructions (Signed)
We have placed a referral for PT at [facility].

## 2017-07-15 NOTE — Progress Notes (Signed)
Kimberly FellsMichael D. Kimberly Shinerigby, DO  [facility] [phone]  Kimberly ModeJoanne Mcguire - 62 y.o. female MRN [id number]  Date of birth: 01-26-1955  Visit Date: 07/15/2017  PCP: Kimberly Mcguire, Erica, DO   Referred by: Kimberly Mcguire, Erica, DO  Scribe(s) for today's visit: Kimberly Mcguire, CMA  SUBJECTIVE:  Kimberly Mcguire is here for Follow-up (R knee pain)   06/03/2017: Her R knee pain symptoms INITIALLY: Began 09/2016 but then flared up again 04/2017. In 09/2016 she pivoted over a baby gate and that's when the pain started. In April the pain flared up again after climbing up and down a ladder.  Described as moderate-severe aching, non-radiating. Worsened with weight bearing, up and down stairs.  Improved with rest. Additional associated symptoms include: Sx are worse at night. Pain is mostly on the medial aspect of the knee. The knee is tender to palpation. There is swelling present. She has noticed clicking and popping in the knee. Yesterday she had jury duty and sitting in the chair for that long period of time caused a lot of pressure and increased pain. She was helping a family member move recently and also noticed some swelling at the lateral aspect of the R ankle.    At this time symptoms show no change compared to onset. Pain comes and goes depending on activity level.  She has been taking IBU with some relief. She has also tried icing the knee with some relief. She tried OTC compression sleeve but it kept rolling down her leg. She has also tried wearing a band under her knee with some relief.   07/15/2017: Compared to the last office visit, her previously described symptoms are improving, she continues to have pain when going up and down stairs. She still has still been experiencing occasional clicking and popping in the knee but not as frequently.  She notes that when her knee is hurting she has been pain with her varicose veins and swelling on the lateral aspect of the  ankle.  Current symptoms are mild & are non-radiating She has been icing her knee as needed - she feels this is more for the varicose veins.    REVIEW OF SYSTEMS: Denies night time disturbances. Denies fevers, chills, or night sweats. Denies unexplained weight loss. Denies personal history of cancer. Denies changes in bowel or bladder habits. Denies recent unreported falls. Denies new or worsening dyspnea or wheezing. Denies headaches or dizziness.  Denies numbness, tingling or weakness  In the extremities.  Denies dizziness or presyncopal episodes Reports lower extremity edema    HISTORY & PERTINENT PRIOR DATA:  Prior History reviewed and updated per electronic medical record.  Significant/pertinent history, findings, studies include:  reports that she has never smoked. She has never used smokeless tobacco. No results for input(s): HGBA1C, LABURIC, CREATINE in the last 8760 hours. The 10-year ASCVD risk score Denman George(Goff DC Montez HagemanJr., et al., 2013) is: 5.5%   Values used to calculate the score:     Age: [age] years     Sex: Female     Is Non-Hispanic African American: No     Diabetic: No     Tobacco smoker: No     Systolic Blood Pressure: 130 mmHg     Is BP treated: Yes     HDL Cholesterol: 62 mg/dL     Total Cholesterol: 178 mg/dL No problems updated.  OBJECTIVE:  VS:  HT:5\' 2"  (157.5 cm)   WT:212 lb 6.4 oz (96.3 kg)  BMI:38.84  BP:130/86  HR:90bpm  TEMP: ( )  RESP:95 %   PHYSICAL EXAM: Constitutional: WDWN, Non-toxic appearing. Psychiatric: Alert & appropriately interactive.  Not depressed or anxious appearing. Respiratory: No increased work of breathing.  Trachea Midline Eyes: Pupils are equal.  EOM intact without nystagmus.  No scleral icterus  Vascular Exam: warm to touch no edema  lower extremity neuro exam: unremarkable  MSK Exam: Right knee is overall well aligned but she does have pain with terminal extension although is able to get to close to 0 degrees.  She  has pain with McMurray's as well as with medial lateral joint line palpation but this is mild.  She has a slightly antalgic gait.  Hip abduction strength is poor but functional.   ASSESSMENT & PLAN:   1. Right knee pain, unspecified chronicity   2. Primary osteoarthritis of right knee   3. Gait disturbance     PLAN: Overall she has actually improved fairly significant but continues to have some weakness.  She would actually like to undergo physical therapy and referral for this was placed today.  This will benefit her functional status and she will follow-up with [place] if any lack of improvement.  Can consider knee injections if needed.  Follow-up: Return if symptoms worsen or fail to improve.      Please see additional documentation for Objective, Assessment and Plan sections. Pertinent additional documentation may be included in corresponding procedure notes, imaging studies, problem based documentation and patient instructions. Please see these sections of the encounter for additional information regarding this visit.  CMA/ATC served as Neurosurgeon during this visit. History, Physical, and Plan performed by medical provider. Documentation and orders reviewed and attested to.      Kimberly Mews, DO    [place] [facility] Physician

## 2017-08-02 ENCOUNTER — Ambulatory Visit: Payer: 59 | Admitting: Neurology

## 2017-08-02 ENCOUNTER — Encounter: Payer: Self-pay | Admitting: Neurology

## 2017-08-02 VITALS — BP 134/90 | HR 96 | Ht 62.0 in | Wt 212.0 lb

## 2017-08-02 DIAGNOSIS — R0689 Other abnormalities of breathing: Secondary | ICD-10-CM

## 2017-08-02 DIAGNOSIS — R0683 Snoring: Secondary | ICD-10-CM | POA: Diagnosis not present

## 2017-08-02 DIAGNOSIS — G4763 Sleep related bruxism: Secondary | ICD-10-CM | POA: Diagnosis not present

## 2017-08-02 DIAGNOSIS — J302 Other seasonal allergic rhinitis: Secondary | ICD-10-CM | POA: Insufficient documentation

## 2017-08-02 DIAGNOSIS — F5104 Psychophysiologic insomnia: Secondary | ICD-10-CM

## 2017-08-02 DIAGNOSIS — R5382 Chronic fatigue, unspecified: Secondary | ICD-10-CM | POA: Insufficient documentation

## 2017-08-02 MED ORDER — ALPRAZOLAM 0.5 MG PO TABS: 0.5000 mg | ORAL_TABLET | Freq: Every evening | ORAL | 0 refills | Status: DC | PRN

## 2017-08-02 NOTE — Progress Notes (Signed)
SLEEP MEDICINE CLINIC   Provider:  Melvyn Novas, M D  Primary Care Physician:  [name], DO   Referring Provider: [name], DO    Chief Complaint  Patient presents with  . New Patient (Initial Visit)    pt alone, rm 10. pt states that she doesnt sleep very well. she has been told when she sleeps she has heavy breathing and periods of apnea. wakes up gasping for air sometimes. pt states avg that difficulty sleeping 3/4 am and on.     HPI:  Kimberly Mcguire is a 62 y.o. female patient , seen here as in a referral from Dr. Earlene Plater for a sleep evaluation. Chief complaint according to patient :stating that she is struggling for many, many years with insomnia. She struggles to go to sleep-and she  wakes up startled , unable to breathe, her husband noted loud , audible breathing alternating with pauses- that than cause jerking movements.  She sleeps on her side, having trouble to breath in supine.   Sleep habits are as follows:  Watching TV or reading - dinner at 7.30-8 Pm, Bedtime is around 11-11.30- with a TV on in the bedroom. " or  my mind will be racing ".  She will stay asleep for 20 minutes only, and she goes to the bathroom- not that the urge to urinate woke her. She jerks, startles to alertness, feels anxious.  She shares the bedroom with her husband. Sleeps only on her side. Nose is always stuffy, with or without Claritin - she  sleeps on 3 pillows. Nocturia 4-5 times.  Husband gets up at 6 , she wakes - she will use a sleep mask and ear plugs, and wakes again 7.30 AM - with dream sleep in that time. No dry mouth, no palpitations, dizziness or diaphoresis.   Sleep medical history and family medical and sleep history:  sleepy now when driving long distance. 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    Social history:  She took care of her mother - Alzheimer's, she took care of her parents and in laws in other states.  Moved around a lot-  husband was unfaithful- they have lived in many states. 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    Married. Adult children.  Non smoker- life long and no exposure to second hand smoke. ETOH, 1 glass of wine a night. 5 a week.  Caffeine : one coffee a day , no soda and neither tea/ iced tea. She has been drinking more caffeine when she drives long distance. She owns  Homes, one here one in [place]- where the grandchildren live. No exercise since knee pain.        Review of Systems: Out of a complete 14 system review, the patient complains of only the following symptoms, and all other reviewed systems are negative.  choking, snoring, startled in sleep. Insomnia, nocturia.   Epworth score 13/24, Fatigue severity score 31  ,   Social History   Socioeconomic History  . Marital status: Married    Spouse name: Not on file  . Number of children: 3  . Years of education: Not on file  . Highest education level: Not on file  Occupational History  . Not on file  Social Needs  . Financial resource strain: Not on file  . Food insecurity:    Worry: Not on file  Inability: Not on file  . Transportation needs:    Medical: Not on file    Non-medical: Not on file  Tobacco Use  . Smoking status: Never Smoker  . Smokeless tobacco: Never Used  Substance and Sexual Activity  . Alcohol use: Yes    Alcohol/week: 3.6 oz    Types: 6 Glasses of wine per week  . Drug use: No  . Sexual activity: Not on file  Lifestyle  . Physical activity:    Days per week: Not on file    Minutes per session: Not on file  . Stress: Not on file  Relationships  . Social connections:    Talks on phone: Not on file    Gets together: Not on file    Attends religious service: Not on file    Active member of club or organization: Not on file    Attends meetings of clubs or organizations: Not on file    Relationship  status: Not on file  . Intimate partner violence:    Fear of current or ex partner: Not on file    Emotionally abused: Not on file    Physically abused: Not on file    Forced sexual activity: Not on file  Other Topics Concern  . Not on file  Social History Narrative  . Not on file    Family History  Problem Relation Age of Onset  . Diabetes Mother   . Hypertension Mother   . Dementia Mother   . Colon cancer Maternal Uncle   . Lung cancer Father     Past Medical History:  Diagnosis Date  . Hypertension   . Hypothyroidism     No past surgical history on file.  Current Outpatient Medications  Medication Sig Dispense Refill  . acyclovir ointment (ZOVIRAX) 5 % Apply 1 application topically every 3 (three) hours.    . Azelastine HCl 0.15 % SOLN PLACE 2 SPRAYS INTO THE NOSE 2 TIMES DAILY. 30 mL 6  . Cholecalciferol (VITAMIN D3) 5000 units CAPS Take 1 capsule by mouth daily.    . Estradiol (VAGIFEM) 10 MCG TABS vaginal tablet Place 1 tablet vaginally once a week.     . fluticasone (VERAMYST) 27.5 MCG/SPRAY nasal spray Place 2 sprays into the nose daily.    Marland Kitchen. levothyroxine (SYNTHROID, LEVOTHROID) 50 MCG tablet Take 1 tablet (50 mcg total) by mouth daily before breakfast. 90 tablet 1  . lisinopril (PRINIVIL) 10 MG tablet Take 1 tablet (10 mg total) by mouth daily. 90 tablet 3  . loratadine (CLARITIN) 10 MG tablet Take 10 mg by mouth daily.     No current facility-administered medications for this visit.     Allergies as of 08/02/2017 - Review Complete 08/02/2017  Allergen Reaction Noted  . Latex  07/09/2016  . Sulfa antibiotics Swelling 07/09/2016  . Penicillins Rash 07/09/2016    Vitals: BP 134/90   Pulse 96   Ht 5\' 2"  (1.575 m)   Wt 212 lb (96.2 kg)   BMI 38.78 kg/m  Last Weight:  Wt Readings from Last 1 Encounters:  08/02/17 212 lb (96.2 kg)   WUJ:WJXBBMI:Body mass index is 38.78 kg/m.     Last Height:   Ht Readings from Last 1 Encounters:  08/02/17 5\' 2"  (1.575 m)     Physical exam:  General: The patient is awake, alert and appears not in acute distress. The patient is well groomed. Head: Normocephalic, atraumatic. Neck is supple. Mallampati 4-5 ,  neck circumference:16". Nasal airflow  patent ,  Retrognathia is not seen.  Cardiovascular:  Regular rate and rhythm , without  murmurs or carotid bruit, and without distended neck veins. Respiratory: Lungs are clear to auscultation. Skin:  Without evidence of edema, or rash Trunk: BMI is 39. The patient's posture is erect.   Neurologic exam : The patient is awake and alert, oriented to place and time.   Memory subjective described as intact. Attention span & concentration ability appears normal.  Speech is fluent, without dysarthria, dysphonia or aphasia.  Mood and affect are appropriate.  Cranial nerves: Pupils are equal and briskly reactive to light. Funduscopic exam without evidence of pallor or edema. Extraocular movements  in vertical and horizontal planes intact and without nystagmus. Visual fields by finger perimetry are intact. Hearing to finger rub intact.   Facial sensation intact to fine touch.  Facial motor strength is symmetric and tongue and uvula move midline. Shoulder shrug was symmetrical.   Motor exam: Normal tone, muscle bulk and symmetric strength in all extremities. Sensory:  Fine touch, pinprick and vibration were tested in all extremities. Proprioception tested in the upper extremities was normal. Coordination: Finger-to-nose maneuver  normal without evidence of ataxia, dysmetria or tremor. Gait and station: Patient walks without assistive device - she is able unassisted to climb up to the exam table. Strength within normal limits.Stance is stable and normal. Turns with 3 Steps. Deep tendon reflexes: in the upper and lower extremities are symmetric and intact.    Assessment:  After physical and neurologic examination, review of laboratory studies,  Personal review of imaging  studies, reports of other /same  Imaging studies, results of polysomnography and / or neurophysiology testing and pre-existing records as far as provided in visit., my assessment is   1)  Probable sleep apnea presence- witnessed breathing pauses, snoring and choking. BMI, body built, neck size.   2)  Excessive daytime sleepiness result of poor sleep- insomnia, lack of sufficient sleep time, naps 2-3 a week. Brief power naps are refreshing   3)   Racing thoughts- there is an element of depression, stress and anxiety- mind is racing, TV is on as a distraction,   We discussed the TV to be replaced by reading a book with pages, turning the clocks away, no electronics.    The patient was advised of the nature of the diagnosed disorder , the treatment options and the  risks for general health and wellness arising from not treating the condition.   I spent more than 45  minutes of face to face time with the patient.  Greater than 50% of time was spent in counseling and coordination of care. We have discussed the diagnosis and differential and I answered the patient's questions.    Plan:  Treatment plan and additional workup :  OSA work up, insomnia - sleep hygiene and boot camp discussed, start exercise 30 minute walk in AM -in daylight.  Please have PCP check cortisol levels ( Addisonian , not Cushings )  and  Thyroid function- because of sleepiness, hair loss and the neck "hump" .    Melvyn Novas, MD 08/02/2017, 1:21 PM  Certified in Neurology by ABPN Certified in Sleep Medicine by [facility] [address], [address] [place], [place] [address]

## 2017-08-02 NOTE — Addendum Note (Signed)
Addended by: Melvyn NovasHMEIER, Angelique Chevalier on: 08/02/2017 02:14 PM   Modules accepted: Orders

## 2017-08-03 ENCOUNTER — Encounter: Payer: Self-pay | Admitting: Physical Therapy

## 2017-08-03 ENCOUNTER — Ambulatory Visit (INDEPENDENT_AMBULATORY_CARE_PROVIDER_SITE_OTHER): Payer: 59 | Admitting: Physical Therapy

## 2017-08-03 DIAGNOSIS — G8929 Other chronic pain: Secondary | ICD-10-CM

## 2017-08-03 DIAGNOSIS — M25561 Pain in right knee: Secondary | ICD-10-CM | POA: Diagnosis not present

## 2017-08-03 NOTE — Therapy (Addendum)
[facility] [address] [place], [place], [address] Phone: [phone]   Fax:  [phone]  Physical Therapy Evaluation  Patient Details  Name: Kimberly Mcguire MRN: [id number] Date of Birth: 1955/05/12 Referring Provider: Teresa Coombs   Encounter Date: 08/03/2017  PT End of Session - 08/03/17 1124    Visit Number  1    Number of Visits  12    Date for PT Re-Evaluation  09/28/17    Authorization Type  [employer]    PT Start Time  6861    PT Stop Time  1102    PT Time Calculation (min)  47 min    Activity Tolerance  Patient tolerated treatment well    Behavior During Therapy  [facility] for tasks assessed/performed       Past Medical History:  Diagnosis Date  . Hypertension   . Hypothyroidism     History reviewed. No pertinent surgical history.  There were no vitals filed for this visit.   Subjective Assessment - 08/03/17 1037    Subjective  Pt states ongoing pain in R knee,for several months now. She did get injection, which helped some, but she is still having pain, mostly with increased standing, walking and activity. She states that she wants to get back to exercising, and would like to know what to do for her knee. She states several factors that she would like to improve, to help her to loose weight: sleep, diet, exercise.  Pt currently is not exercising. She also currently lives between 2 houses (here and at [employer]) and it is difficult for her to be consistent due to traveling.     Diagnostic tests  + X-ray for tricompartmental degeneration.     Patient Stated Goals  Improve knee pain, start exercising to "get in shape"     Currently in Pain?  Yes    Pain Score  2     Pain Location  Knee    Pain Orientation  Right    Pain Descriptors / Indicators  Aching    Pain Type  Chronic pain    Pain Onset  More than a month ago    Pain Frequency  Intermittent    Aggravating Factors   stairs, standing, walking,          [facility] PT Assessment  - 08/03/17 0001      Assessment   Medical Diagnosis  R Knee Pain    Referring Provider  Teresa Coombs    Prior Therapy  No      Precautions   Precautions  None      Balance Screen   Has the patient fallen in the past 6 months  No      Prior Function   Level of Independence  Independent      Cognition   Overall Cognitive Status  Within Functional Limits for tasks assessed      ROM / Strength   AROM / PROM / Strength  AROM;Strength      AROM   Overall AROM Comments  Bil Knees: WNL;  Bil Hips: WNL      Strength   Overall Strength Comments  Knees: 4+/5;  Hips: 4/5      Palpation   Palpation comment  Patella: inferior and lateral tilt, decreased tracking; Mild pain at medial joint line with deep palpation.                 Objective measurements completed on examination: See above findings.  [facility] Adult PT Treatment/Exercise - 08/03/17 0001      Exercises   Exercises  Knee/Hip      Knee/Hip Exercises: Seated   Long Arc Quad  10 reps;Both      Knee/Hip Exercises: Supine   Quad Sets  10 reps;Both    Heel Slides  10 reps;Right    Straight Leg Raises  10 reps;Both      Knee/Hip Exercises: Sidelying   Hip ABduction  10 reps;Both             PT Education - 08/03/17 1123    Education Details  PT POC, HEP    Person(s) Educated  Patient    Methods  Explanation    Comprehension  Verbalized understanding;Need further instruction       PT Short Term Goals - 08/03/17 1126      PT SHORT TERM GOAL #1   Title  Pt to be independent with initial HEP    Time  2    Period  Weeks    Status  New    Target Date  08/17/17        PT Long Term Goals - 08/03/17 1127      PT LONG TERM GOAL #1   Title  Pt to be independent with long term HEP for strength of LE.     Time  8    Period  Weeks    Status  New    Target Date  09/28/17      PT LONG TERM GOAL #2   Title  Pt to report decreased pain in R knee, to 0-2/10 with activity.     Time  8     Period  Weeks    Status  New    Target Date  09/28/17      PT LONG TERM GOAL #3   Title  Pt to demo increased strength of R knee, to 5/5 to improve ability for IADLs and stairs.     Time  8    Period  Weeks    Status  New    Target Date  09/28/17             Plan - 08/03/17 1128    Clinical Impression Statement  Pt presents with primary complaint of increased pain in R knee, consistent with OA. Pt with minimal pain with palpation, but has increasd pain with standing and walking. She has decreased strength of knees and hips. She also has poor endurance for standing and walking due to pain. She has lack of effective HEP for diagnosis, and would like to start exercising. Pt with decreased ability for full functional activities, due to pain. Pt to benefit from skilled PT to improve deficits, and return to PLOF without pain.     Clinical Presentation  Stable    Clinical Decision Making  Low    Rehab Potential  Good    Clinical Impairments Affecting Rehab Potential  Pt will likely be able to attend PT 1-2x/wk, she is currently living between 2 houses in different locations. She may be only be able to come every other week.     PT Frequency  2x / week    PT Duration  8 weeks    PT Treatment/Interventions  ADLs/Self Care Home Management;Cryotherapy;Electrical Stimulation;Iontophoresis 21m/ml Dexamethasone;Moist Heat;Therapeutic activities;Functional mobility training;Stair training;Gait training;DME Instruction;Ultrasound;Therapeutic exercise;Balance training;Neuromuscular re-education;Patient/family education;Orthotic Fit/Training;Dry needling;Passive range of motion;Manual techniques;Taping;Vasopneumatic Device    PT Next Visit Plan  Progress HEP    Consulted  and Agree with Plan of Care  Patient       Patient will benefit from skilled therapeutic intervention in order to improve the following deficits and impairments:  Abnormal gait, Decreased activity tolerance, Decreased strength, Pain,  Difficulty walking, Decreased mobility, Decreased balance, Improper body mechanics  Visit Diagnosis: Chronic pain of right knee     Problem List Patient Active Problem List   Diagnosis Date Noted  . Sleep related choking sensation 08/02/2017  . Snoring 08/02/2017  . Bruxism, sleep-related 08/02/2017  . Seasonal allergic rhinitis 08/02/2017  . Chronic fatigue 08/02/2017  . Postmenopausal 10/18/2016  . Obesity (BMI 30-39.9) 10/18/2016  . Essential hypertension 10/18/2016  . Acquired hypothyroidism 10/18/2016    Lyndee Hensen, PT, DPT 11:34 AM  08/03/17    [facility] [place], [place], [address] Phone: [phone]   Fax:  [phone]  Name: Kimberly Mcguire MRN: [id number] Date of Birth: 1955-05-19   PHYSICAL THERAPY DISCHARGE SUMMARY  Visits from Start of Care:  1  Plan: Patient agrees to discharge.  Patient goals were not met. Patient is being discharged due to not returning since the last visit.  ?????      Lyndee Hensen, PT, DPT 3:04 PM  11/02/17

## 2017-08-03 NOTE — Patient Instructions (Signed)
Access Code: [email or web address]  URL: [email or web address]  Date: 08/03/2017  Prepared by: Sedalia MutaLauren Eriana Suliman   Exercises  Long Sitting Quad Set - 10 reps - 2 sets - 2x daily  Straight Leg Raise - 10 reps - 2 sets - 2x daily  Sidelying Hip Abduction - 10 reps - 2 sets - 2x daily  Seated Long Arc Quad - 10 reps - 2 sets - 2x daily

## 2017-08-04 ENCOUNTER — Telehealth: Payer: Self-pay

## 2017-08-04 DIAGNOSIS — G4763 Sleep related bruxism: Secondary | ICD-10-CM

## 2017-08-04 DIAGNOSIS — R5382 Chronic fatigue, unspecified: Secondary | ICD-10-CM

## 2017-08-04 DIAGNOSIS — R0683 Snoring: Secondary | ICD-10-CM

## 2017-08-04 DIAGNOSIS — R0689 Other abnormalities of breathing: Secondary | ICD-10-CM

## 2017-08-04 DIAGNOSIS — I10 Essential (primary) hypertension: Secondary | ICD-10-CM

## 2017-08-04 NOTE — Telephone Encounter (Signed)
insurnace denies in lab SPLIT, ordered HST.

## 2017-08-04 NOTE — Telephone Encounter (Signed)
Insurance will deny in lab study request. Please order HST. Thanks!

## 2017-08-04 NOTE — Addendum Note (Signed)
Addended by: Melvyn NovasHMEIER, Lian Tanori on: 08/04/2017 04:14 PM   Modules accepted: Orders

## 2017-09-15 ENCOUNTER — Ambulatory Visit (INDEPENDENT_AMBULATORY_CARE_PROVIDER_SITE_OTHER): Payer: 59 | Admitting: Neurology

## 2017-09-15 DIAGNOSIS — I10 Essential (primary) hypertension: Secondary | ICD-10-CM

## 2017-09-15 DIAGNOSIS — G4733 Obstructive sleep apnea (adult) (pediatric): Secondary | ICD-10-CM | POA: Diagnosis not present

## 2017-09-15 DIAGNOSIS — R0689 Other abnormalities of breathing: Secondary | ICD-10-CM

## 2017-09-15 DIAGNOSIS — F5104 Psychophysiologic insomnia: Secondary | ICD-10-CM

## 2017-09-15 DIAGNOSIS — G4763 Sleep related bruxism: Secondary | ICD-10-CM

## 2017-09-15 DIAGNOSIS — R5382 Chronic fatigue, unspecified: Secondary | ICD-10-CM

## 2017-09-15 DIAGNOSIS — R0683 Snoring: Secondary | ICD-10-CM

## 2017-09-17 NOTE — Addendum Note (Signed)
Addended by: Melvyn NovasHMEIER, Bralynn Donado on: 09/17/2017 01:29 PM   Modules accepted: Orders

## 2017-09-17 NOTE — Procedures (Signed)
NAME:  Kimberly Mcguire                                                               DOB: 02/04/1955 MEDICAL RECORD NUMBER  [id number]                                              DOS:  09/15/2017 REFERRING PHYSICIAN: Helane RimaErica Wallace, DO STUDY PERFORMED: Home Sleep Study on watch pat HISTORY: Kimberly Mcguire is a 62 y.o. female patient seen on 08-02-2017 for a sleep evaluation. She is struggling for many, many years with insomnia and is sleepier in daytime. She struggles to go to sleep-and she wakes up startled, unable to breathe. Her husband noted audible breathing pauses- that then end in jerking movements and another breath. She sleeps on her side, having trouble to breath in supine. No dry mouth, no palpitations, dizziness or diaphoresis BMI: 38.9; Epworth SS endorsed at 13/24 points, FSS at 31/63 points.  STUDY RESULTS:  Total Recording Time: 6 hours 33 minutes; valid test time 4 h 57 minutes. Total Apnea/Hypopnea Index (AHI):  56.4 /h, RDI: 56.8 /h. Average Oxygen Saturation: 93 %; Lowest Oxygen Saturation: 85 %  Total Time in Oxygen Saturation below 89 %: 2.2 minutes  Average Heart Rate: 70 bpm (between 48 and 88 bpm) IMPRESSION: Severe Obstructive Sleep Apnea at AHI of 56.4/h. surprisingly without associated prolonged hypoxemia. Hypoxemia was likely REM sleep dependent. REM AHI estimated at 72/h.  RECOMMENDATION: This degree of sleep apnea needs immediate intervention:  1) CPAP auto-titration 5-18 cm water 3 cm EPR, heated humidity and a comfortable interface for the patient. 2) A referral to medical weight management is highly recommended.  3) Improving sleep habits- eliminate TV, electronics from the bedroom.   I certify that I have reviewed the raw data recording prior to the issuance of this report in accordance with the standards of the American Academy of Sleep Medicine (AASM). Melvyn Novasarmen Shellsea Borunda, M.D.  09-17-2017    Medical Director of [facility], accredited by the AASM. Diplomat of the  ABPN and ABSM.

## 2017-09-20 ENCOUNTER — Encounter: Payer: Self-pay | Admitting: Sports Medicine

## 2017-09-21 ENCOUNTER — Other Ambulatory Visit: Payer: Self-pay | Admitting: Neurology

## 2017-09-21 ENCOUNTER — Telehealth: Payer: Self-pay | Admitting: Neurology

## 2017-09-21 DIAGNOSIS — R5382 Chronic fatigue, unspecified: Secondary | ICD-10-CM

## 2017-09-21 DIAGNOSIS — E669 Obesity, unspecified: Secondary | ICD-10-CM

## 2017-09-21 DIAGNOSIS — R0683 Snoring: Secondary | ICD-10-CM

## 2017-09-21 NOTE — Telephone Encounter (Signed)
I called pt. I advised pt that Dr. Vickey Hugerohmeier reviewed their sleep study results and found that pt has sleep apnea. Dr. Vickey Hugerohmeier recommends that pt start a auto CPAP. I reviewed PAP compliance expectations with the pt. Pt is agreeable to starting a CPAP. I advised pt that an order will be sent to a DME, [facility], and [facility] will call the pt within about one week after they file with the pt's insurance. [facility] will show the pt how to use the machine, fit for masks, and troubleshoot the CPAP if needed. A follow up appt was made for insurance purposes with Dr. Vickey Hugerohmeier on Dec 11,2019 at 8:30 am. Pt verbalized understanding to arrive 15 minutes early and bring their CPAP. A letter with all of this information in it will be mailed to the pt as a reminder. I verified with the pt that the address we have on file is correct. Pt verbalized understanding of results. Pt had no questions at this time but was encouraged to call back if questions arise.

## 2017-09-21 NOTE — Telephone Encounter (Signed)
-----   Message from Melvyn Novasarmen Dohmeier, MD sent at 09/17/2017  1:29 PM EDT ----- IMPRESSION: Severe Obstructive Sleep Apnea at AHI of 56.4/h.  surprisingly without associated prolonged hypoxemia. Hypoxemia  was likely REM sleep dependent. REM AHI estimated at 72/h.  RECOMMENDATION: This degree of sleep apnea needs immediate  intervention:  1) CPAP auto-titration 5-18 cm water 3 cm EPR, heated humidity  and a comfortable interface for the patient. 2) A referral to medical weight management is highly recommended.   3) Improving sleep habits- eliminate TV, electronics from the  bedroom.

## 2017-09-28 DIAGNOSIS — G4733 Obstructive sleep apnea (adult) (pediatric): Secondary | ICD-10-CM | POA: Diagnosis not present

## 2017-10-13 ENCOUNTER — Other Ambulatory Visit: Payer: Self-pay | Admitting: Family Medicine

## 2017-10-13 DIAGNOSIS — E039 Hypothyroidism, unspecified: Secondary | ICD-10-CM

## 2017-10-24 ENCOUNTER — Other Ambulatory Visit: Payer: Self-pay | Admitting: Family Medicine

## 2017-10-28 DIAGNOSIS — G4733 Obstructive sleep apnea (adult) (pediatric): Secondary | ICD-10-CM | POA: Diagnosis not present

## 2017-10-29 ENCOUNTER — Other Ambulatory Visit: Payer: Self-pay | Admitting: Family Medicine

## 2017-10-29 DIAGNOSIS — Z1231 Encounter for screening mammogram for malignant neoplasm of breast: Secondary | ICD-10-CM

## 2017-11-01 ENCOUNTER — Ambulatory Visit
Admission: RE | Admit: 2017-11-01 | Discharge: 2017-11-01 | Disposition: A | Discharge status: Home / Self Care | Payer: 59 | Source: Ambulatory Visit | Attending: Family Medicine | Admitting: Family Medicine

## 2017-11-01 DIAGNOSIS — Z1231 Encounter for screening mammogram for malignant neoplasm of breast: Secondary | ICD-10-CM | POA: Diagnosis not present

## 2017-11-03 DIAGNOSIS — Z23 Encounter for immunization: Secondary | ICD-10-CM | POA: Diagnosis not present

## 2017-11-09 DIAGNOSIS — G4733 Obstructive sleep apnea (adult) (pediatric): Secondary | ICD-10-CM | POA: Diagnosis not present

## 2017-11-28 DIAGNOSIS — G4733 Obstructive sleep apnea (adult) (pediatric): Secondary | ICD-10-CM | POA: Diagnosis not present

## 2017-12-13 ENCOUNTER — Encounter: Payer: Self-pay | Admitting: Neurology

## 2017-12-15 ENCOUNTER — Encounter: Payer: Self-pay | Admitting: Neurology

## 2017-12-15 ENCOUNTER — Ambulatory Visit: Payer: 59 | Admitting: Neurology

## 2017-12-15 VITALS — BP 129/83 | HR 105 | Ht 62.0 in | Wt 215.0 lb

## 2017-12-15 DIAGNOSIS — G4733 Obstructive sleep apnea (adult) (pediatric): Secondary | ICD-10-CM | POA: Diagnosis not present

## 2017-12-15 DIAGNOSIS — E039 Hypothyroidism, unspecified: Secondary | ICD-10-CM

## 2017-12-15 DIAGNOSIS — I10 Essential (primary) hypertension: Secondary | ICD-10-CM

## 2017-12-15 DIAGNOSIS — Z9989 Dependence on other enabling machines and devices: Secondary | ICD-10-CM

## 2017-12-15 DIAGNOSIS — R0689 Other abnormalities of breathing: Secondary | ICD-10-CM | POA: Diagnosis not present

## 2017-12-15 NOTE — Progress Notes (Signed)
SLEEP MEDICINE CLINIC   Provider:  Melvyn Novas, MD   Primary Care Physician:  [name], DO   Referring Provider: [name], DO    Chief Complaint  Patient presents with  . Follow-up    pt alone, rm 10. pt states machine is working well. only concern is that her nasal airway will get dry. DME [facility]     HPI:  Kimberly Mcguire is a 63 y.o. female patient , seen here as in a referral from Dr. Earlene Plater for a sleep evaluation. Chief complaint according to patient :stating that she is struggling for many, many years with insomnia. She struggles to go to sleep-and she  wakes up startled , unable to breathe, her husband noted loud , audible breathing alternating with pauses- that than cause jerking movements.  She sleeps on her side, having trouble to breath in supine.   Sleep habits are as follows:  Watching TV or reading - dinner at 7.30-8 Pm, Bedtime is around 11-11.30- with a TV on in the bedroom. " or  my mind will be racing ".  She will stay asleep for 20 minutes only, and she goes to the bathroom- not that the urge to urinate woke her. She jerks, startles to alertness, feels anxious.  She shares the bedroom with her husband. Sleeps only on her side. Nose is always stuffy, with or without Claritin - she  sleeps on 3 pillows.Nocturia 4-5 times. Husband gets up at 6 , she wakes - she will use a sleep mask and ear plugs, and wakes again 7.30 AM - with dream sleep in that time. No dry mouth, no palpitations, dizziness or diaphoresis.   Sleep medical history and family medical and sleep history:  sleepy now when driving long distance. 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  Social history:  She took care of her mother - Alzheimer's, she took care of her parents and in laws in other states.  Moved around a lot- husband was unfaithful- they have lived in many states. 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    Married. Adult children. Non smoker- life long and no exposure to second hand smoke. ETOH, 1 glass of wine a night. 5 a week. Caffeine : one coffee a day , no soda and neither tea/ iced tea. She has been drinking more caffeine when she drives long distance. She owns  Homes, one here one in [place]- where the grandchildren live. No exercise since knee pain.  NAME:  Kimberly Mcguire                                                               DOB: 1955-07-22 MEDICAL RECORD NUMBER  [id number]                                              DOS:  09/15/2017 REFERRING PHYSICIAN: [name], DO STUDY PERFORMED: Home Sleep Study on watch pat HISTORY: Kimberly Mcguire is a 62 y.o. female patient seen on 08-02-2017 for a sleep evaluation. She is struggling  for many, many years with insomnia and is sleepier in daytime. She struggles to go to sleep-and she wakes up startled, unable to breathe. Her husband noted audible breathing pauses- that then end in jerking movements and another breath. She sleeps on her side, having trouble to breath in supine. No dry mouth, no palpitations, dizziness or diaphoresis BMI: 38.9; Epworth SS endorsed at 13/24 points, FSS at 31/63 points.  STUDY RESULTS:  Total Recording Time: 6 hours 33 minutes; valid test time 4 h 57 minutes. Total Apnea/Hypopnea Index (AHI):  56.4 /h, RDI: 56.8 /h. Average Oxygen Saturation: 93 %; Lowest Oxygen Saturation: 85 %  Total Time in Oxygen Saturation below 89 %: 2.2 minutes  Average Heart Rate: 70 bpm (between 48 and 88 bpm) IMPRESSION: Severe Obstructive Sleep Apnea at AHI of 56.4/h. surprisingly without associated prolonged hypoxemia. Hypoxemia was likely REM sleep dependent. REM AHI estimated at 72/h.  RECOMMENDATION: This degree of sleep apnea needs immediate intervention:  1) CPAP auto-titration 5-18 cm water 3 cm EPR, heated humidity and a comfortable interface for the patient. 2) A referral  to medical weight management is highly recommended.  3) Improving sleep habits- eliminate TV, electronics from the bedroom.   I certify that I have reviewed the raw data recording prior to the issuance of this report in accordance with the standards of the American Academy of Sleep Medicine (AASM). Melvyn Novas, M.D.  09-17-2017    12-15-2017,Kimberly Mcguire is a 62 y.o. female patient , seen here as in a referral from Dr. Earlene Plater for a sleep evaluation. Her HST confirmed severe apnea as seen above.  The patient started on auto titration CPAP with a minimum pressure of 5 and a maximum pressure of 18 cmH2O and 1 cm expiratory pressure relief.  She has been 100% compliant by days and time with an average use at time of 5 hours and 31 minutes each night.  Her residual AHI is 0.9/h.  She does have mild to moderate air leakage, the pressure at the 95th percentile is only 9.5 cm- so I feel confident that we can restrict the appear pressure limit. Nasal pillow, airfit p 10.      Review of Systems: Out of a complete 14 system review, the patient complains of only the following symptoms, and all other reviewed systems are negative.  choking, snoring, startled in sleep. Insomnia, nocturia.    Pre CPAP- Epworth score 13/24, Fatigue severity score 31. Her post CPAP Epworth sleepiness score is endorsed at 4 points and her fatigue score at 12 points both is significantly reduced in comparison to pre-CPAP times.  She reports that she sleeps well and even when she falls asleep without her CPAP she now breezes easier and better.     Social History   Socioeconomic History  . Marital status: Married    Spouse name: Not on file  . Number of children: 3  . Years of education: Not on file  . Highest education level: Not on file  Occupational History  . Not on file  Social Needs  . Financial resource strain: Not on file  . Food insecurity:    Worry: Not on file    Inability: Not on file  .  Transportation needs:    Medical: Not on file    Non-medical: Not on file  Tobacco Use  . Smoking status: Never Smoker  . Smokeless tobacco: Never Used  Substance and Sexual Activity  . Alcohol use: Yes    Alcohol/week: 6.0 standard  drinks    Types: 6 Glasses of wine per week  . Drug use: No  . Sexual activity: Not on file  Lifestyle  . Physical activity:    Days per week: Not on file    Minutes per session: Not on file  . Stress: Not on file  Relationships  . Social connections:    Talks on phone: Not on file    Gets together: Not on file    Attends religious service: Not on file    Active member of club or organization: Not on file    Attends meetings of clubs or organizations: Not on file    Relationship status: Not on file  . Intimate partner violence:    Fear of current or ex partner: Not on file    Emotionally abused: Not on file    Physically abused: Not on file    Forced sexual activity: Not on file  Other Topics Concern  . Not on file  Social History Narrative  . Not on file    Family History  Problem Relation Age of Onset  . Diabetes Mother   . Hypertension Mother   . Dementia Mother   . Colon cancer Maternal Uncle   . Lung cancer Father     Past Medical History:  Diagnosis Date  . Hypertension   . Hypothyroidism     No past surgical history on file.  Current Outpatient Medications  Medication Sig Dispense Refill  . acyclovir ointment (ZOVIRAX) 5 % Apply 1 application topically as needed.     . ALPRAZolam (XANAX) 0.5 MG tablet Take 1 tablet (0.5 mg total) by mouth at bedtime as needed for anxiety. 12 tablet 0  . Azelastine HCl 0.15 % SOLN PLACE 2 SPRAYS INTO THE NOSE 2 TIMES DAILY. 30 mL 2  . Cholecalciferol (VITAMIN D3) 5000 units CAPS Take 1 capsule by mouth daily.    . Estradiol (VAGIFEM) 10 MCG TABS vaginal tablet Place 1 tablet vaginally once a week.     . levothyroxine (SYNTHROID, LEVOTHROID) 50 MCG tablet TAKE 1 TABLET (50 MCG TOTAL) BY  MOUTH DAILY BEFORE BREAKFAST. 90 tablet 1  . lisinopril (PRINIVIL) 10 MG tablet Take 1 tablet (10 mg total) by mouth daily. 90 tablet 3  . loratadine (CLARITIN) 10 MG tablet Take 10 mg by mouth daily as needed.      No current facility-administered medications for this visit.     Allergies as of 12/15/2017 - Review Complete 12/15/2017  Allergen Reaction Noted  . Latex  07/09/2016  . Sulfa antibiotics Swelling 07/09/2016  . Penicillins Rash 07/09/2016    Vitals: BP 129/83   Pulse (!) 105   Ht 5\' 2"  (1.575 m)   Wt 215 lb (97.5 kg)   BMI 39.32 kg/m  Last Weight:  Wt Readings from Last 1 Encounters:  12/15/17 215 lb (97.5 kg)   ONG:EXBMBMI:Body mass index is 39.32 kg/m.     Last Height:   Ht Readings from Last 1 Encounters:  12/15/17 5\' 2"  (1.575 m)    Physical exam:  General: The patient is awake, alert and appears not in acute distress. The patient is well groomed. Head: Normocephalic, atraumatic. Neck is supple. Mallampati 4-5 ,  neck circumference:16". Nasal airflow patent ,  Retrognathia is not seen.  Cardiovascular:  Regular rate and rhythm , without  murmurs or carotid bruit, and without distended neck veins. Respiratory: Lungs are clear to auscultation. Skin:  Without evidence of edema, or rash Trunk: BMI is  39. The patient's posture is erect.   Neurologic exam : The patient is awake and alert, oriented to place and time.   Memory subjective described as intact. Attention span & concentration ability appears normal.  Speech is fluent, without dysarthria, dysphonia or aphasia.  Mood and affect are appropriate.  Cranial nerves: Pupils are equal and briskly reactive to light. Funduscopic exam without evidence of pallor or edema. Extraocular movements  in vertical and horizontal planes intact and without nystagmus. Visual fields by finger perimetry are intact. Hearing to finger rub intact.   Facial sensation intact to fine touch.  Facial motor strength is symmetric and  tongue and uvula move midline. Shoulder shrug was symmetrical.   Motor exam: Normal tone, muscle bulk and symmetric strength in all extremities. Sensory:  Fine touch, pinprick and vibration were tested in all extremities. Proprioception tested in the upper extremities was normal. Coordination: Finger-to-nose maneuver  normal without evidence of ataxia, dysmetria or tremor. Gait and station: Patient walks without assistive device - she is able unassisted to climb up to the exam table. Strength within normal limits.Stance is stable and normal. Turns with 3 Steps. Deep tendon reflexes: in the upper and lower extremities are symmetric and intact.    Assessment:  After physical and neurologic examination, review of laboratory studies,  Personal review of imaging studies, reports of other /same  Imaging studies, results of polysomnography and / or neurophysiology testing and pre-existing records as far as provided in visit., my assessment is   1) Obstructive sleep apnea - witnessed breathing pauses, snoring and choking. BMI, body built, neck size.  2)  Excessive daytime sleepiness is now resolved/  3)  Obesity- she is scheduled to see [facility] next week.   Racing thoughts- there is an element of depression, stress and anxiety- mind is racing, TV is on as a distraction. We discussed the TV to be replaced by reading a book with pages, turning the clocks away, no electronics.    The patient was advised of the nature of the diagnosed disorder , the treatment options and the  risks for general health and wellness arising from not treating the condition.   I spent more than 45  minutes of face to face time with the patient.  Greater than 50% of time was spent in counseling and coordination of care. We have discussed the diagnosis and differential and I answered the patient's questions.    Plan:  Continue CPAP. See you in 12 month. reduction of upper CPAP pressure to 13 cm water.   Melvyn Novas, MD 12/15/2017, 8:35 AM  Certified in Neurology by ABPN Certified in Sleep Medicine by [facility] [address] [place], [place] [address]

## 2017-12-21 ENCOUNTER — Encounter (INDEPENDENT_AMBULATORY_CARE_PROVIDER_SITE_OTHER): Payer: 59

## 2017-12-28 DIAGNOSIS — G4733 Obstructive sleep apnea (adult) (pediatric): Secondary | ICD-10-CM | POA: Diagnosis not present

## 2018-01-18 ENCOUNTER — Ambulatory Visit (INDEPENDENT_AMBULATORY_CARE_PROVIDER_SITE_OTHER): Payer: 59 | Admitting: Bariatrics

## 2018-01-18 ENCOUNTER — Encounter (INDEPENDENT_AMBULATORY_CARE_PROVIDER_SITE_OTHER): Payer: Self-pay | Admitting: Bariatrics

## 2018-01-18 VITALS — BP 120/78 | HR 93 | Temp 97.9°F | Ht 63.0 in | Wt 212.0 lb

## 2018-01-18 DIAGNOSIS — R5383 Other fatigue: Secondary | ICD-10-CM

## 2018-01-18 DIAGNOSIS — E559 Vitamin D deficiency, unspecified: Secondary | ICD-10-CM | POA: Diagnosis not present

## 2018-01-18 DIAGNOSIS — I10 Essential (primary) hypertension: Secondary | ICD-10-CM

## 2018-01-18 DIAGNOSIS — G4733 Obstructive sleep apnea (adult) (pediatric): Secondary | ICD-10-CM | POA: Diagnosis not present

## 2018-01-18 DIAGNOSIS — R0602 Shortness of breath: Secondary | ICD-10-CM | POA: Diagnosis not present

## 2018-01-18 DIAGNOSIS — E038 Other specified hypothyroidism: Secondary | ICD-10-CM

## 2018-01-18 DIAGNOSIS — Z9189 Other specified personal risk factors, not elsewhere classified: Secondary | ICD-10-CM | POA: Diagnosis not present

## 2018-01-18 DIAGNOSIS — Z1331 Encounter for screening for depression: Secondary | ICD-10-CM | POA: Diagnosis not present

## 2018-01-18 DIAGNOSIS — R7309 Other abnormal glucose: Secondary | ICD-10-CM

## 2018-01-18 DIAGNOSIS — Z0289 Encounter for other administrative examinations: Secondary | ICD-10-CM

## 2018-01-18 DIAGNOSIS — Z6837 Body mass index (BMI) 37.0-37.9, adult: Secondary | ICD-10-CM

## 2018-01-19 LAB — T4, FREE: Free T4: 1.33 ng/dL (ref 0.82–1.77)

## 2018-01-19 LAB — COMPREHENSIVE METABOLIC PANEL
A/G RATIO: 1.9 (ref 1.2–2.2)
ALK PHOS: 57 IU/L (ref 39–117)
ALT: 32 IU/L (ref 0–32)
AST: 20 IU/L (ref 0–40)
Albumin: 4.8 g/dL (ref 3.6–4.8)
BILIRUBIN TOTAL: 0.5 mg/dL (ref 0.0–1.2)
BUN/Creatinine Ratio: 19 (ref 12–28)
BUN: 13 mg/dL (ref 8–27)
CHLORIDE: 100 mmol/L (ref 96–106)
CO2: 24 mmol/L (ref 20–29)
Calcium: 10.2 mg/dL (ref 8.7–10.3)
Creatinine, Ser: 0.68 mg/dL (ref 0.57–1.00)
GFR calc Af Amer: 108 mL/min/{1.73_m2} (ref >59)
GFR calc non Af Amer: 93 mL/min/{1.73_m2} (ref >59)
GLUCOSE: 103 mg/dL — AB (ref 65–99)
Globulin, Total: 2.5 g/dL (ref 1.5–4.5)
POTASSIUM: 4.5 mmol/L (ref 3.5–5.2)
Sodium: 141 mmol/L (ref 134–144)
Total Protein: 7.3 g/dL (ref 6.0–8.5)

## 2018-01-19 LAB — T3: T3, Total: 136 ng/dL (ref 71–180)

## 2018-01-19 LAB — TSH: TSH: 2.06 u[IU]/mL (ref 0.450–4.500)

## 2018-01-19 LAB — VITAMIN D 25 HYDROXY (VIT D DEFICIENCY, FRACTURES): VIT D 25 HYDROXY: 38.6 ng/mL (ref 30.0–100.0)

## 2018-01-19 LAB — HEMOGLOBIN A1C
Est. average glucose Bld gHb Est-mCnc: 114 mg/dL
HEMOGLOBIN A1C: 5.6 % (ref 4.8–5.6)

## 2018-01-19 LAB — INSULIN, RANDOM: INSULIN: 25 u[IU]/mL — ABNORMAL HIGH (ref 2.6–24.9)

## 2018-01-19 NOTE — Progress Notes (Signed)
Office: [phone]  /  Fax: [phone]   Dear Dr. Vickey Mcguire,   Thank you for referring Kimberly Mcguire to our clinic. The following note includes my evaluation and treatment recommendations.  HPI:   Chief Complaint: OBESITY    Kimberly Mcguire has been referred by Kimberly Novas, MD for consultation regarding her obesity and obesity related comorbidities.    Kimberly Mcguire (MR# [id number]) is a 63 y.o. female who presents on 01/19/2018 for obesity evaluation and treatment. Current BMI is Body mass index is 37.55 kg/m.Kimberly Mcguire has been struggling with her weight for many years and has been unsuccessful in either losing weight, maintaining weight loss, or reaching her healthy weight goal.     Kimberly Mcguire attended our information session and states she is currently in the action stage of change and ready to dedicate time achieving and maintaining a healthier weight. [name] is interested in becoming our patient and working on intensive lifestyle modifications including (but not limited to) diet, exercise and weight loss.    [name] states her family eats meals together she thinks her family will eat healthier with  her her desired weight loss is 55 lbs she has been heavy most of  her life she started gaining weight at approximately 49 yrs of age her heaviest weight ever was 215 lbs. she feels like she is hungry most of the time she has significant food cravings issues  she snacks frequently in the evenings she is frequently drinking liquids with calories she has problems with excessive hunger  she frequently eats larger portions than normal  she struggles with emotional eating    Fatigue [name] feels her energy is lower than it should be. This has worsened with weight gain and has not worsened recently. [name] admits to daytime somnolence and since using a CPAP machine, she wakes up mostly refreshed. Patient has a diagnosis of obstructive sleep apnea, which may contribute to her fatigue. Patent has  a history of symptoms of daytime fatigue and hypertension. Patient generally gets 8 hours of sleep per night, and states they generally have adequate sleep. Snoring is not present. Apneic episodes are present. Epworth Sleepiness Score is 8  Dyspnea on exertion Kimberly Mcguire notes increasing shortness of breath with exercising and seems to be worsening over time with weight gain. She notes getting out of breath sooner with activity than she used to. This has not gotten worse recently. [name] denies orthopnea.  OSA (obstructive sleep apnea) [name] has a diagnosis of obstructive sleep apnea and she is wearing her CPAP nightly.  Hypothyroidism [name] has a diagnosis of hypothyroidism. She is on levothyroxine. She denies excessive hot or cold intolerance.   Hypertension Kimberly Mcguire is a 63 y.o. female with hypertension. She is currently taking Lisinopril. Kimberly Mcguire denies chest pain. She is working weight loss to help control her blood pressure with the goal of decreasing her risk of heart attack and stroke. [name] blood pressure is well controlled.  At risk for cardiovascular disease [name] is at a higher than average risk for cardiovascular disease due to obesity and hypertension. She currently denies any chest pain.  Elevated Glucose Kimberly Mcguire has a history of some elevated blood glucose readings without a diagnosis of diabetes. She admits to polyphagia.  Vitamin D deficiency Kimberly Mcguire has a diagnosis of vitamin D deficiency. She is currently taking OTC vit D and denies nausea, vomiting or muscle weakness.  Depression Screen Xitlalli's Food and Mood (modified PHQ-9) score was  Depression screen [facility] 2/9 01/18/2018  Decreased Interest  2  Down, Depressed, Hopeless 1  PHQ - 2 Score 3  Altered sleeping 1  Tired, decreased energy 2  Change in appetite 1  Feeling bad or failure about yourself  1  Trouble concentrating 1  Moving slowly or fidgety/restless 0  Suicidal thoughts 0  PHQ-9 Score 9    Difficult doing work/chores Not difficult at all    ASSESSMENT AND PLAN:  Other fatigue - Plan: EKG 12-Lead  Shortness of breath on exertion  OSA (obstructive sleep apnea)  Other specified hypothyroidism - Plan: T3, T4, free, TSH  Essential hypertension  Elevated glucose - Plan: Comprehensive metabolic panel, Hemoglobin A1c, Insulin, random  Vitamin D deficiency - Plan: VITAMIN D 25 Hydroxy (Vit-D Deficiency, Fractures)  Depression screening  At risk for heart disease  Class 2 severe obesity with serious comorbidity and body mass index (BMI) of 37.0 to 37.9 in adult, unspecified obesity type (HCC)  PLAN:  Fatigue [name] was informed that her fatigue may be related to obesity, depression or many other causes. Labs will be ordered, and in the meanwhile [name] has agreed to work on diet, exercise and weight loss to help with fatigue. Proper sleep hygiene was discussed including the need for 7-8 hours of quality sleep each night.   Dyspnea on exertion [name]'s shortness of breath appears to be obesity related and exercise induced. She has agreed to work on weight loss and gradually increase exercise to treat her exercise induced shortness of breath. If [name] follows our instructions and loses weight without improvement of her shortness of breath, we will plan to refer to pulmonology. We will monitor this condition regularly. [name] agrees to this plan.  OSA (obstructive sleep apnea) [name] will continue to wear her CPAP. She will follow up with our clinic in 2 weeks.  Hypothyroidism [name] was informed of the importance of good thyroid control to help with weight loss efforts. She was also informed that supertheraputic thyroid levels are dangerous and will not improve weight loss results. We will check thyroid panel today and [name] will follow up at the agreed upon time.  Hypertension We discussed sodium restriction, working on healthy weight loss, and a regular exercise  program as the means to achieve improved blood pressure control. [name] agreed with this plan and agreed to follow up as directed. We will continue to monitor her blood pressure as well as her progress with the above lifestyle modifications. She will continue her medications as prescribed and will watch for signs of hypotension as she continues her lifestyle modifications.  Cardiovascular risk counseling Charlestyn was given extended (15 minutes) coronary artery disease prevention counseling today. She is 63 y.o. female and has risk factors for heart disease including obesity and hypertension. We discussed intensive lifestyle modifications today with an emphasis on specific weight loss instructions and strategies. Pt was also informed of the importance of increasing exercise and decreasing saturated fats to help prevent heart disease.  Elevated Glucose Fasting labs (Hgb A1c, insulin level) will be obtained and results with be discussed with Kimberly Mcguire in 2 weeks at her follow up visit. In the meanwhile Kimberly Mcguire was started on a lower simple carbohydrate diet and will work on weight loss efforts.  Vitamin D Deficiency Laykyn was informed that low vitamin D levels contributes to fatigue and are associated with obesity, breast, and colon cancer. She will continue OTC vitamin D and will follow up for routine testing of vitamin D, at least 2-3 times per year. She was informed of the risk  of over-replacement of vitamin D and agrees to not increase her dose unless she discusses this with us first. We will check vitamin D level today and Kimberly Mcguire will follow up as directed.  Depression Screen Kimberly Mcguire had a mildly positive depression screening. Depression is commonly associated with obesity and often results in emotional eating behaviors. We will monitor this closely and work on CBT to help improve the non-hunger eating patterns. Referral to Psychology may be required if no improvement is seen as she continues in our  clinic.  Obesity Kimberly Mcguire is currently in the action stage of change and her goal is to continue with weight loss efforts. I recommend Kimberly Mcguire begin the structured treatment plan as follows:  She has agreed to follow the Category 2 plan Kimberly Mcguire has been instructed to eventually work up to a goal of 150 minutes of combined cardio and strengthening exercise per week for weight loss and overall health benefits. We discussed the following Behavioral Modification Strategies today: increase H2O intake, keeping healthy foods in the home, increasing lean protein intake, decreasing simple carbohydrates, increasing vegetables and work on meal planning and easy cooking plans   She was informed of the importance of frequent follow up visits to maximize her success with intensive lifestyle modifications for her multiple health conditions. She was informed we would discuss her lab results at her next visit unless there is a critical issue that needs to be addressed sooner. Kimberly Mcguire agreed to keep her next visit at the agreed upon time to discuss these results.  ALLERGIES: Allergies  Allergen Reactions  . Latex   . Sulfa Antibiotics Swelling  . Penicillins Rash    MEDICATIONS: Current Outpatient Medications on File Prior to Visit  Medication Sig Dispense Refill  . acyclovir ointment (ZOVIRAX) 5 % Apply 1 application topically as needed.     . Azelastine HCl 0.15 % SOLN PLACE 2 SPRAYS INTO THE NOSE 2 TIMES DAILY. 30 mL 2  . Cholecalciferol (VITAMIN D3) 5000 units CAPS Take 1 capsule by mouth daily.    . Estradiol (VAGIFEM) 10 MCG TABS vaginal tablet Place 1 tablet vaginally once a week.     . levothyroxine (SYNTHROID, LEVOTHROID) 50 MCG tablet TAKE 1 TABLET (50 MCG TOTAL) BY MOUTH DAILY BEFORE BREAKFAST. 90 tablet 1  . lisinopril (PRINIVIL) 10 MG tablet Take 1 tablet (10 mg total) by mouth daily. 90 tablet 3  . Multiple Vitamin (MULTIVITAMIN) capsule Take 1 capsule by mouth daily.    . Omega-3 Fatty Acids  (FISH OIL) 1200 MG CPDR Take 1,400 mg by mouth.    . Saccharomyces boulardii (PROBIOTIC) 250 MG CAPS Take 500 mg by mouth.    . Turmeric 500 MG CAPS Take by mouth.    . ALPRAZolam (XANAX) 0.5 MG tablet Take 1 tablet (0.5 mg total) by mouth at bedtime as needed for anxiety. (Patient not taking: Reported on 01/18/2018) 12 tablet 0  . loratadine (CLARITIN) 10 MG tablet Take 10 mg by mouth daily as needed.      No current facility-administered medications on file prior to visit.     PAST MEDICAL HISTORY: Past Medical History:  Diagnosis Date  . Allergies   . Anxiety   . Arthritis   . Depression   . Heart valve problem   . Hypertension   . Hypothyroidism   . Palpitations   . Right knee pain   . Sleep apnea   . Swelling of both lower extremities     PAST SURGICAL HISTORY: History reviewed.  No pertinent surgical history.  SOCIAL HISTORY: Social History   Tobacco Use  . Smoking status: Never Smoker  . Smokeless tobacco: Never Used  Substance Use Topics  . Alcohol use: Yes    Alcohol/week: 6.0 standard drinks    Types: 6 Glasses of wine per week  . Drug use: No    FAMILY HISTORY: Family History  Problem Relation Age of Onset  . Diabetes Mother   . Hypertension Mother   . Dementia Mother   . Thyroid disease Mother   . Depression Mother   . Anxiety disorder Mother   . Obesity Mother   . Colon cancer Maternal Uncle   . Lung cancer Father     ROS: Review of Systems  Constitutional: Positive for malaise/fatigue.  HENT: Positive for congestion (nasal stuffiness), nosebleeds and sinus pain.   Eyes:       + Vision Changes + Wear Glasses or Contacts  Respiratory: Positive for shortness of breath (on exertion).   Cardiovascular: Negative for chest pain and orthopnea.  Gastrointestinal: Negative for nausea and vomiting.  Musculoskeletal: Positive for joint pain.       Negative for muscle weakness  Endo/Heme/Allergies:       Positive for polyphagia   Psychiatric/Behavioral: The patient has insomnia.        + Stress    PHYSICAL EXAM: Blood pressure 120/78, pulse 93, temperature 97.9 F (36.6 C), temperature source Oral, height 5\' 3"  (1.6 m), weight 212 lb (96.2 kg), SpO2 97 %. Body mass index is 37.55 kg/m. Physical Exam Vitals signs reviewed.  Constitutional:      Appearance: Normal appearance. She is well-developed. She is obese.  HENT:     Head: Normocephalic and atraumatic.     Nose: Nose normal.     Mouth/Throat:     Comments: Mallampati = 4 Eyes:     General: No scleral icterus.    Extraocular Movements: Extraocular movements intact.  Neck:     Musculoskeletal: Normal range of motion and neck supple.     Thyroid: No thyromegaly.  Cardiovascular:     Rate and Rhythm: Normal rate and regular rhythm.  Pulmonary:     Effort: Pulmonary effort is normal. No respiratory distress.  Abdominal:     Palpations: Abdomen is soft.     Tenderness: There is no abdominal tenderness.  Musculoskeletal: Normal range of motion.     Comments: Range of Motion normal in all 4 extremities  Skin:    General: Skin is warm and dry.  Neurological:     Mental Status: She is alert and oriented to person, place, and time.     Coordination: Coordination normal.  Psychiatric:        Mood and Affect: Mood normal.        Behavior: Behavior normal.     RECENT LABS AND TESTS: BMET    Component Value Date/Time   NA 141 01/18/2018 0950   K 4.5 01/18/2018 0950   CL 100 01/18/2018 0950   CO2 24 01/18/2018 0950   GLUCOSE 103 (H) 01/18/2018 0950   GLUCOSE 96 04/19/2017 1030   BUN 13 01/18/2018 0950   CREATININE 0.68 01/18/2018 0950   CALCIUM 10.2 01/18/2018 0950   GFRNONAA 93 01/18/2018 0950   GFRAA 108 01/18/2018 0950   Lab Results  Component Value Date   HGBA1C 5.6 01/18/2018   Lab Results  Component Value Date   INSULIN 25.0 (H) 01/18/2018   CBC    Component Value Date/Time  WBC 5.2 04/19/2017 1030   RBC 4.39 04/19/2017  1030   HGB 14.9 04/19/2017 1030   HCT 42.7 04/19/2017 1030   PLT 244.0 04/19/2017 1030   MCV 97.2 04/19/2017 1030   MCHC 34.9 04/19/2017 1030   RDW 13.1 04/19/2017 1030   LYMPHSABS 2.0 04/19/2017 1030   MONOABS 0.4 04/19/2017 1030   EOSABS 0.1 04/19/2017 1030   BASOSABS 0.0 04/19/2017 1030   Iron/TIBC/Ferritin/ %Sat No results found for: IRON, TIBC, FERRITIN, IRONPCTSAT Lipid Panel     Component Value Date/Time   CHOL 178 06/08/2017 0859   TRIG 142.0 06/08/2017 0859   HDL 62.00 06/08/2017 0859   CHOLHDL 3 06/08/2017 0859   VLDL 28.4 06/08/2017 0859   LDLCALC 88 06/08/2017 0859   Hepatic Function Panel     Component Value Date/Time   PROT 7.3 01/18/2018 0950   ALBUMIN 4.8 01/18/2018 0950   AST 20 01/18/2018 0950   ALT 32 01/18/2018 0950   ALKPHOS 57 01/18/2018 0950   BILITOT 0.5 01/18/2018 0950      Component Value Date/Time   TSH 2.060 01/18/2018 0950   TSH 2.38 04/19/2017 1030   TSH 2.34 10/20/2016 0904    ECG  shows NSR with a rate of 93 BPM INDIRECT CALORIMETER done today shows a VO2 of 205 and a REE of 1429.  Her calculated basal metabolic rate is 1610 thus her basal metabolic rate is worse than expected.       OBESITY BEHAVIORAL INTERVENTION VISIT  Today's visit was # 1   Starting weight: 212 lbs Starting date: 01/18/2018 Today's weight : 212 lbs Today's date: 01/18/2018 Total lbs lost to date: 0   ASK: We discussed the diagnosis of obesity with Woodroe Mode today and Lakyla agreed to give [place] permission to discuss obesity behavioral modification therapy today.  ASSESS: Raynee has the diagnosis of obesity and her BMI today is 37.56 Kenda is in the action stage of change   ADVISE: Caily was educated on the multiple health risks of obesity as well as the benefit of weight loss to improve her health. She was advised of the need for long term treatment and the importance of lifestyle modifications to improve her current health and to decrease her  risk of future health problems.  AGREE: Multiple dietary modification options and treatment options were discussed and  Makylee agreed to follow the recommendations documented in the above note.  ARRANGE: Turner was educated on the importance of frequent visits to treat obesity as outlined per CMS and USPSTF guidelines and agreed to schedule her next follow up appointment today.  Cristi Loron, am acting as Energy manager for [employer]. Manson Passey, DO  I have reviewed the above documentation for accuracy and completeness, and I agree with the above. -Corinna Capra, DO

## 2018-01-20 ENCOUNTER — Encounter (INDEPENDENT_AMBULATORY_CARE_PROVIDER_SITE_OTHER): Payer: Self-pay | Admitting: Bariatrics

## 2018-01-20 DIAGNOSIS — G4733 Obstructive sleep apnea (adult) (pediatric): Secondary | ICD-10-CM | POA: Insufficient documentation

## 2018-01-28 DIAGNOSIS — G4733 Obstructive sleep apnea (adult) (pediatric): Secondary | ICD-10-CM | POA: Diagnosis not present

## 2018-02-01 ENCOUNTER — Encounter (INDEPENDENT_AMBULATORY_CARE_PROVIDER_SITE_OTHER): Payer: Self-pay | Admitting: Bariatrics

## 2018-02-01 ENCOUNTER — Ambulatory Visit (INDEPENDENT_AMBULATORY_CARE_PROVIDER_SITE_OTHER): Payer: 59 | Admitting: Bariatrics

## 2018-02-01 VITALS — BP 128/72 | HR 93 | Temp 98.1°F | Ht 63.0 in | Wt 207.0 lb

## 2018-02-01 DIAGNOSIS — I10 Essential (primary) hypertension: Secondary | ICD-10-CM

## 2018-02-01 DIAGNOSIS — E559 Vitamin D deficiency, unspecified: Secondary | ICD-10-CM

## 2018-02-01 DIAGNOSIS — E8881 Metabolic syndrome: Secondary | ICD-10-CM

## 2018-02-01 DIAGNOSIS — Z6836 Body mass index (BMI) 36.0-36.9, adult: Secondary | ICD-10-CM

## 2018-02-01 DIAGNOSIS — Z9189 Other specified personal risk factors, not elsewhere classified: Secondary | ICD-10-CM

## 2018-02-01 MED ORDER — VITAMIN D (ERGOCALCIFEROL) 1.25 MG (50000 UNIT) PO CAPS: 50000.0000 [IU] | ORAL_CAPSULE | ORAL | 0 refills | Status: DC

## 2018-02-02 DIAGNOSIS — E8881 Metabolic syndrome: Secondary | ICD-10-CM | POA: Insufficient documentation

## 2018-02-02 NOTE — Progress Notes (Signed)
Office: [phone]  /  Fax: [phone]   HPI:   Chief Complaint: OBESITY Kimberly Mcguire is here to discuss her progress with her obesity treatment plan. She is on the Category 2 plan and is following her eating plan approximately 95 % of the time. She states she is exercising 0 minutes 0 times per week. Kimberly Mcguire did well over the last two weeks. She did struggle with every meal (overwhelmed over the meat). She states that she has no inappropriate hunger. She does have some cravings at night. Her weight is 207 lb (93.9 kg) today and has had a weight loss of 5 pounds over a period of 2 weeks since her last visit. She has lost 5 lbs since starting treatment with [place].  Hypertension Kimberly Mcguire is a 64 y.o. female with hypertension. She is taking Lisinopril. Kimberly Mcguire denies chest pain or shortness of breath on exertion. She is working weight loss to help control her blood Mcguire with the goal of decreasing her risk of heart attack and stroke. Kimberly Mcguire is well controlled.  Insulin Resistance Kimberly Mcguire has a diagnosis of insulin resistance based on her elevated fasting insulin level >5. Although Kimberly Mcguire's blood glucose readings are still under good control, insulin resistance puts her at greater risk of metabolic syndrome and diabetes. Her last A1c was at 5.6 and last insulin level was at 25.0 She is not taking medications currently and continues to work on diet and exercise to decrease risk of diabetes. She denies polyphagia.  Vitamin D deficiency Kimberly Mcguire has a diagnosis of vitamin D deficiency. Her last level was at 38.6 She is currently taking vit D and denies nausea, vomiting or muscle weakness.  At risk for osteopenia and osteoporosis Kimberly Mcguire is at higher risk of osteopenia and osteoporosis due to vitamin D deficiency.   ASSESSMENT AND PLAN:  Essential hypertension  Insulin resistance  Vitamin D deficiency - Plan: Vitamin D, Ergocalciferol, (DRISDOL) 1.25 MG (50000 UT) CAPS  capsule  At risk for osteoporosis  Class 2 severe obesity with serious comorbidity and body mass index (BMI) of 36.0 to 36.9 in adult, unspecified obesity type (HCC)  PLAN:  Hypertension We discussed sodium restriction, working on healthy weight loss, and a regular exercise program as the means to achieve improved blood Mcguire control. Kimberly Mcguire agreed with this plan and agreed to follow up as directed. We will continue to monitor her blood Mcguire as well as her progress with the above lifestyle modifications. She will continue Lisinopril as prescribed and will watch for signs of hypotension as she continues her lifestyle modifications.  Insulin Resistance Kimberly Mcguire will continue to work on weight loss, exercise, and decreasing simple carbohydrates in her diet to help decrease the risk of diabetes. We dicussed insulin resistance in detail and metformin. She was informed that eating too many simple carbohydrates or too many calories at one sitting increases the likelihood of GI side effects. Kimberly Mcguire will consider metformin if she has increased hunger. Kimberly Mcguire agreed to follow up with [place] as directed to monitor her progress.  Vitamin D Deficiency Kimberly Mcguire was informed that low vitamin D levels contributes to fatigue and are associated with obesity, breast, and colon cancer. She agrees to continue to take prescription Vit D @50 ,000 IU every week #4 with no refills and will follow up for routine testing of vitamin D, at least 2-3 times per year. She was informed of the risk of over-replacement of vitamin D and agrees to not increase her dose unless she discusses this  with us first. Kimberly Mcguire agrees to follow up as directed.  At risk for osteopenia and osteoporosis Kimberly Mcguire was given extended  (15 minutes) osteoporosis prevention counseling today. Kimberly Mcguire is at risk for osteopenia and osteoporosis due to her vitamin D deficiency. She was encouraged to take her vitamin D and follow her higher calcium diet and  increase strengthening exercise to help strengthen her bones and decrease her risk of osteopenia and osteoporosis.  Obesity Kimberly Mcguire is currently in the action stage of change. As such, her goal is to continue with weight loss efforts She has agreed to follow the Category 2 plan Kimberly Mcguire has been instructed to work up to a goal of 150 minutes of combined cardio and strengthening exercise per week for weight loss and overall health benefits. We discussed the following Behavioral Modification Strategies today: increase H2O intake, no skipping meals, increasing lean protein intake, decreasing simple carbohydrates, increasing vegetables, work on meal planning and easy cooking plans and ways to avoid boredom eating She will move her protein around throughout the day and she will work on meal preparation  Kimberly Mcguire has agreed to follow up with our clinic in 2 weeks. She was informed of the importance of frequent follow up visits to maximize her success with intensive lifestyle modifications for her multiple health conditions.  ALLERGIES: Allergies  Allergen Reactions  . Latex   . Sulfa Antibiotics Swelling  . Penicillins Rash    MEDICATIONS: Current Outpatient Medications on File Prior to Visit  Medication Sig Dispense Refill  . acyclovir ointment (ZOVIRAX) 5 % Apply 1 application topically as needed.     . ALPRAZolam (XANAX) 0.5 MG tablet Take 1 tablet (0.5 mg total) by mouth at bedtime as needed for anxiety. 12 tablet 0  . Azelastine HCl 0.15 % SOLN PLACE 2 SPRAYS INTO THE NOSE 2 TIMES DAILY. 30 mL 2  . Cholecalciferol (VITAMIN D3) 5000 units CAPS Take 1 capsule by mouth daily.    . Estradiol (VAGIFEM) 10 MCG TABS vaginal tablet Place 1 tablet vaginally once a week.     . levothyroxine (SYNTHROID, LEVOTHROID) 50 MCG tablet TAKE 1 TABLET (50 MCG TOTAL) BY MOUTH DAILY BEFORE BREAKFAST. 90 tablet 1  . lisinopril (PRINIVIL) 10 MG tablet Take 1 tablet (10 mg total) by mouth daily. 90 tablet 3  .  loratadine (CLARITIN) 10 MG tablet Take 10 mg by mouth daily as needed.     . Multiple Vitamin (MULTIVITAMIN) capsule Take 1 capsule by mouth daily.    . Omega-3 Fatty Acids (FISH OIL) 1200 MG CPDR Take 1,400 mg by mouth.    . Saccharomyces boulardii (PROBIOTIC) 250 MG CAPS Take 500 mg by mouth.    . Turmeric 500 MG CAPS Take by mouth.     No current facility-administered medications on file prior to visit.     PAST MEDICAL HISTORY: Past Medical History:  Diagnosis Date  . Allergies   . Anxiety   . Arthritis   . Depression   . Heart valve problem   . Hypertension   . Hypothyroidism   . Palpitations   . Right knee pain   . Sleep apnea   . Swelling of both lower extremities     PAST SURGICAL HISTORY: History reviewed. No pertinent surgical history.  SOCIAL HISTORY: Social History   Tobacco Use  . Smoking status: Never Smoker  . Smokeless tobacco: Never Used  Substance Use Topics  . Alcohol use: Yes    Alcohol/week: 6.0 standard drinks    Types:  6 Glasses of wine per week  . Drug use: No    FAMILY HISTORY: Family History  Problem Relation Age of Onset  . Diabetes Mother   . Hypertension Mother   . Dementia Mother   . Thyroid disease Mother   . Depression Mother   . Anxiety disorder Mother   . Obesity Mother   . Colon cancer Maternal Uncle   . Lung cancer Father     ROS: Review of Systems  Constitutional: Positive for weight loss.  Respiratory: Negative for shortness of breath (on exertion).   Cardiovascular: Negative for chest pain.  Gastrointestinal: Negative for nausea and vomiting.  Musculoskeletal:       Negative for muscle weakness  Endo/Heme/Allergies:       Negative for polyphagia    PHYSICAL EXAM: Blood Mcguire 128/72, pulse 93, temperature 98.1 F (36.7 C), temperature source Oral, height 5\' 3"  (1.6 m), weight 207 lb (93.9 kg), SpO2 98 %. Body mass index is 36.67 kg/m. Physical Exam Vitals signs reviewed.  Constitutional:       Appearance: Normal appearance. She is well-developed. She is obese.  Cardiovascular:     Rate and Rhythm: Normal rate.  Pulmonary:     Effort: Pulmonary effort is normal.  Musculoskeletal: Normal range of motion.  Skin:    General: Skin is warm and dry.  Neurological:     Mental Status: She is alert and oriented to person, place, and time.  Psychiatric:        Mood and Affect: Mood normal.        Behavior: Behavior normal.     RECENT LABS AND TESTS: BMET    Component Value Date/Time   NA 141 01/18/2018 0950   K 4.5 01/18/2018 0950   CL 100 01/18/2018 0950   CO2 24 01/18/2018 0950   GLUCOSE 103 (H) 01/18/2018 0950   GLUCOSE 96 04/19/2017 1030   BUN 13 01/18/2018 0950   CREATININE 0.68 01/18/2018 0950   CALCIUM 10.2 01/18/2018 0950   GFRNONAA 93 01/18/2018 0950   GFRAA 108 01/18/2018 0950   Lab Results  Component Value Date   HGBA1C 5.6 01/18/2018   Lab Results  Component Value Date   INSULIN 25.0 (H) 01/18/2018   CBC    Component Value Date/Time   WBC 5.2 04/19/2017 1030   RBC 4.39 04/19/2017 1030   HGB 14.9 04/19/2017 1030   HCT 42.7 04/19/2017 1030   PLT 244.0 04/19/2017 1030   MCV 97.2 04/19/2017 1030   MCHC 34.9 04/19/2017 1030   RDW 13.1 04/19/2017 1030   LYMPHSABS 2.0 04/19/2017 1030   MONOABS 0.4 04/19/2017 1030   EOSABS 0.1 04/19/2017 1030   BASOSABS 0.0 04/19/2017 1030   Iron/TIBC/Ferritin/ %Sat No results found for: IRON, TIBC, FERRITIN, IRONPCTSAT Lipid Panel     Component Value Date/Time   CHOL 178 06/08/2017 0859   TRIG 142.0 06/08/2017 0859   HDL 62.00 06/08/2017 0859   CHOLHDL 3 06/08/2017 0859   VLDL 28.4 06/08/2017 0859   LDLCALC 88 06/08/2017 0859   Hepatic Function Panel     Component Value Date/Time   PROT 7.3 01/18/2018 0950   ALBUMIN 4.8 01/18/2018 0950   AST 20 01/18/2018 0950   ALT 32 01/18/2018 0950   ALKPHOS 57 01/18/2018 0950   BILITOT 0.5 01/18/2018 0950      Component Value Date/Time   TSH 2.060 01/18/2018  0950   TSH 2.38 04/19/2017 1030   TSH 2.34 10/20/2016 0904     Ref.  Range 01/18/2018 09:50  Vitamin D, 25-Hydroxy Latest Ref Range: 30.0 - 100.0 ng/mL 38.6     OBESITY BEHAVIORAL INTERVENTION VISIT  Today's visit was # 2   Starting weight: 212 lbs Starting date: 01/18/2018 Today's weight : 207 lbs Today's date: 02/01/2018 Total lbs lost to date: 5   ASK: We discussed the diagnosis of obesity with Kimberly Mcguire today and Kimberly Mcguire agreed to give [place] permission to discuss obesity behavioral modification therapy today.  ASSESS: Kimberly Mcguire has the diagnosis of obesity and her BMI today is 36.68 Kimberly Mcguire is in the action stage of change   ADVISE: Kimberly Mcguire was educated on the multiple health risks of obesity as well as the benefit of weight loss to improve her health. She was advised of the need for long term treatment and the importance of lifestyle modifications to improve her current health and to decrease her risk of future health problems.  AGREE: Multiple dietary modification options and treatment options were discussed and  Nyhla agreed to follow the recommendations documented in the above note.  ARRANGE: Astryd was educated on the importance of frequent visits to treat obesity as outlined per CMS and USPSTF guidelines and agreed to schedule her next follow up appointment today.  Cristi Loron, am acting as Energy manager for [employer]. Manson Passey, DO  I have reviewed the above documentation for accuracy and completeness, and I agree with the above. -Corinna Capra, DO

## 2018-02-03 ENCOUNTER — Encounter (INDEPENDENT_AMBULATORY_CARE_PROVIDER_SITE_OTHER): Payer: Self-pay | Admitting: Bariatrics

## 2018-02-17 ENCOUNTER — Encounter (INDEPENDENT_AMBULATORY_CARE_PROVIDER_SITE_OTHER): Payer: Self-pay | Admitting: Bariatrics

## 2018-02-17 ENCOUNTER — Ambulatory Visit (INDEPENDENT_AMBULATORY_CARE_PROVIDER_SITE_OTHER): Payer: 59 | Admitting: Bariatrics

## 2018-02-17 VITALS — BP 121/77 | HR 81 | Temp 97.9°F | Ht 63.0 in | Wt 202.0 lb

## 2018-02-17 DIAGNOSIS — Z9189 Other specified personal risk factors, not elsewhere classified: Secondary | ICD-10-CM | POA: Diagnosis not present

## 2018-02-17 DIAGNOSIS — E559 Vitamin D deficiency, unspecified: Secondary | ICD-10-CM | POA: Diagnosis not present

## 2018-02-17 DIAGNOSIS — I10 Essential (primary) hypertension: Secondary | ICD-10-CM | POA: Diagnosis not present

## 2018-02-17 DIAGNOSIS — Z6835 Body mass index (BMI) 35.0-35.9, adult: Secondary | ICD-10-CM

## 2018-02-17 MED ORDER — VITAMIN D (ERGOCALCIFEROL) 1.25 MG (50000 UNIT) PO CAPS: 50000.0000 [IU] | ORAL_CAPSULE | ORAL | 0 refills | Status: DC

## 2018-02-21 DIAGNOSIS — E669 Obesity, unspecified: Secondary | ICD-10-CM | POA: Insufficient documentation

## 2018-02-21 DIAGNOSIS — Z6834 Body mass index (BMI) 34.0-34.9, adult: Secondary | ICD-10-CM

## 2018-02-21 NOTE — Progress Notes (Signed)
Office: [phone]  /  Fax: [phone]   HPI:   Chief Complaint: OBESITY Kimberly Mcguire is here to discuss her progress with her obesity treatment plan. She is on the Category 2 plan and is following her eating plan approximately 93 % of the time. She states she is doing lifting and stairs at home 30 minutes 3 times per week. Kimberly Mcguire is doing well overall. She is getting adequate water and protein as she has increased protein snacks.   Her weight is 202 lb (91.6 kg) today and has had a weight loss of 5 pounds over a period of 2 weeks since her last visit. She has lost 10 lbs since starting treatment with [place].  Vitamin D deficiency Kimberly Mcguire has a diagnosis of vitamin D deficiency. She is currently taking vit D and denies nausea, vomiting, or muscle weakness.  At risk for osteopenia and osteoporosis Kimberly Mcguire is at higher risk of osteopenia and osteoporosis due to vitamin D deficiency.   Hypertension Kimberly Mcguire is a 63 y.o. female with hypertension. Kimberly Mcguire's blood pressure is currently well controlled on lisinopril 10mg .  She is working on weight loss to help control her blood pressure with the goal of decreasing her risk of heart attack and stroke.   ASSESSMENT AND PLAN:  Vitamin D deficiency - Plan: Vitamin D, Ergocalciferol, (DRISDOL) 1.25 MG (50000 UT) CAPS capsule  Essential hypertension  At risk for osteoporosis  Class 2 severe obesity with serious comorbidity and body mass index (BMI) of 35.0 to 35.9 in adult, unspecified obesity type (HCC)  PLAN:  Vitamin D Deficiency Kimberly Mcguire was informed that low vitamin D levels contributes to fatigue and are associated with obesity, breast, and colon cancer. Kimberly Mcguire agrees to continue to take prescription Vit D @50 ,000 IU every week and will follow up for routine testing of vitamin D, at least 2-3 times per year. She was informed of the risk of over-replacement of vitamin D and agrees to not increase her dose unless she discusses this with [place] first.  Jonathan agrees to follow up in 2 weeks as directed.  At risk for osteopenia and osteoporosis Kimberly Mcguire was given extended (30 minutes) osteoporosis prevention counseling today. Kimberly Mcguire is at risk for osteopenia and osteoporosis due to her vitamin D deficiency. She was encouraged to take her vitamin D and follow her higher calcium diet and increase strengthening exercise to help strengthen her bones and decrease her risk of osteopenia and osteoporosis.  Hypertension We discussed sodium restriction, working on healthy weight loss, and a regular exercise program as the means to achieve improved blood pressure control. We will continue to monitor her blood pressure as well as her progress with the above lifestyle modifications. She will continue her medications as prescribed and will watch for signs of hypotension as she continues her lifestyle modifications. Kimberly Mcguire agreed with this plan and agreed to follow up as directed.  Obesity Kimberly Mcguire is currently in the action stage of change. As such, her goal is to continue with weight loss efforts. She has agreed to follow the Category 2 plan and will save some snacks for the evening. She will try a hot drink, such as herbal tea, in the evening or night. She was given the Additional Lunch Options handout. Kimberly Mcguire has been instructed to work up to a goal of 150 minutes of combined cardio and strengthening exercise per week for weight loss and overall health benefits. We discussed the following Behavioral Modification Strategies today: increasing lean protein intake, decreasing simple carbohydrates, increasing vegetables,  increase H2O intake, no skipping meals, keeping healthy foods in the home, and work on meal planning and easy cooking plans.  Kimberly Mcguire has agreed to follow up with our clinic in 2 weeks. She was informed of the importance of frequent follow up visits to maximize her success with intensive lifestyle modifications for her multiple health  conditions.  ALLERGIES: Allergies  Allergen Reactions  . Latex   . Sulfa Antibiotics Swelling  . Penicillins Rash    MEDICATIONS: Current Outpatient Medications on File Prior to Visit  Medication Sig Dispense Refill  . acyclovir ointment (ZOVIRAX) 5 % Apply 1 application topically as needed.     . ALPRAZolam (XANAX) 0.5 MG tablet Take 1 tablet (0.5 mg total) by mouth at bedtime as needed for anxiety. 12 tablet 0  . Azelastine HCl 0.15 % SOLN PLACE 2 SPRAYS INTO THE NOSE 2 TIMES DAILY. 30 mL 2  . Cholecalciferol (VITAMIN D3) 5000 units CAPS Take 1 capsule by mouth daily.    . Estradiol (VAGIFEM) 10 MCG TABS vaginal tablet Place 1 tablet vaginally once a week.     . levothyroxine (SYNTHROID, LEVOTHROID) 50 MCG tablet TAKE 1 TABLET (50 MCG TOTAL) BY MOUTH DAILY BEFORE BREAKFAST. 90 tablet 1  . lisinopril (PRINIVIL) 10 MG tablet Take 1 tablet (10 mg total) by mouth daily. 90 tablet 3  . loratadine (CLARITIN) 10 MG tablet Take 10 mg by mouth daily as needed.     . Multiple Vitamin (MULTIVITAMIN) capsule Take 1 capsule by mouth daily.    . Omega-3 Fatty Acids (FISH OIL) 1200 MG CPDR Take 1,400 mg by mouth.    . Saccharomyces boulardii (PROBIOTIC) 250 MG CAPS Take 500 mg by mouth.    . Turmeric 500 MG CAPS Take by mouth.     No current facility-administered medications on file prior to visit.     PAST MEDICAL HISTORY: Past Medical History:  Diagnosis Date  . Allergies   . Anxiety   . Arthritis   . Depression   . Heart valve problem   . Hypertension   . Hypothyroidism   . Palpitations   . Right knee pain   . Sleep apnea   . Swelling of both lower extremities     PAST SURGICAL HISTORY: History reviewed. No pertinent surgical history.  SOCIAL HISTORY: Social History   Tobacco Use  . Smoking status: Never Smoker  . Smokeless tobacco: Never Used  Substance Use Topics  . Alcohol use: Yes    Alcohol/week: 6.0 standard drinks    Types: 6 Glasses of wine per week  . Drug  use: No    FAMILY HISTORY: Family History  Problem Relation Age of Onset  . Diabetes Mother   . Hypertension Mother   . Dementia Mother   . Thyroid disease Mother   . Depression Mother   . Anxiety disorder Mother   . Obesity Mother   . Colon cancer Maternal Uncle   . Lung cancer Father     ROS: Review of Systems  Constitutional: Positive for weight loss.  Gastrointestinal: Negative for nausea and vomiting.  Musculoskeletal:       Negative for muscle weakness.    PHYSICAL EXAM: Blood pressure 121/77, pulse 81, temperature 97.9 F (36.6 C), temperature source Oral, height 5\' 3"  (1.6 m), weight 202 lb (91.6 kg), SpO2 97 %. Body mass index is 35.78 kg/m. Physical Exam Vitals signs reviewed.  Constitutional:      Appearance: Normal appearance. She is obese.  Cardiovascular:  Rate and Rhythm: Normal rate.  Pulmonary:     Effort: Pulmonary effort is normal.  Musculoskeletal: Normal range of motion.  Skin:    General: Skin is warm and dry.  Neurological:     Mental Status: She is alert and oriented to person, place, and time.  Psychiatric:        Mood and Affect: Mood normal.        Behavior: Behavior normal.     RECENT LABS AND TESTS: BMET    Component Value Date/Time   NA 141 01/18/2018 0950   K 4.5 01/18/2018 0950   CL 100 01/18/2018 0950   CO2 24 01/18/2018 0950   GLUCOSE 103 (H) 01/18/2018 0950   GLUCOSE 96 04/19/2017 1030   BUN 13 01/18/2018 0950   CREATININE 0.68 01/18/2018 0950   CALCIUM 10.2 01/18/2018 0950   GFRNONAA 93 01/18/2018 0950   GFRAA 108 01/18/2018 0950   Lab Results  Component Value Date   HGBA1C 5.6 01/18/2018   Lab Results  Component Value Date   INSULIN 25.0 (H) 01/18/2018   CBC    Component Value Date/Time   WBC 5.2 04/19/2017 1030   RBC 4.39 04/19/2017 1030   HGB 14.9 04/19/2017 1030   HCT 42.7 04/19/2017 1030   PLT 244.0 04/19/2017 1030   MCV 97.2 04/19/2017 1030   MCHC 34.9 04/19/2017 1030   RDW 13.1 04/19/2017  1030   LYMPHSABS 2.0 04/19/2017 1030   MONOABS 0.4 04/19/2017 1030   EOSABS 0.1 04/19/2017 1030   BASOSABS 0.0 04/19/2017 1030   Iron/TIBC/Ferritin/ %Sat No results found for: IRON, TIBC, FERRITIN, IRONPCTSAT Lipid Panel     Component Value Date/Time   CHOL 178 06/08/2017 0859   TRIG 142.0 06/08/2017 0859   HDL 62.00 06/08/2017 0859   CHOLHDL 3 06/08/2017 0859   VLDL 28.4 06/08/2017 0859   LDLCALC 88 06/08/2017 0859   Hepatic Function Panel     Component Value Date/Time   PROT 7.3 01/18/2018 0950   ALBUMIN 4.8 01/18/2018 0950   AST 20 01/18/2018 0950   ALT 32 01/18/2018 0950   ALKPHOS 57 01/18/2018 0950   BILITOT 0.5 01/18/2018 0950      Component Value Date/Time   TSH 2.060 01/18/2018 0950   TSH 2.38 04/19/2017 1030   TSH 2.34 10/20/2016 0904   Results for LEYLANIE, HENIGE (MRN [id number]) as of 02/21/2018 07:13  Ref. Range 01/18/2018 09:50  Vitamin D, 25-Hydroxy Latest Ref Range: 30.0 - 100.0 ng/mL 38.6    OBESITY BEHAVIORAL INTERVENTION VISIT  Today's visit was # 3   Starting weight: 212 lbs Starting date: 01/18/18 Today's weight : Weight: 202 lb (91.6 kg)  Today's date: 02/17/2018 Total lbs lost to date: 10  ASK: We discussed the diagnosis of obesity with Woodroe Mode today and Kimberly Evens agreed to give [place] permission to discuss obesity behavioral modification therapy today.  ASSESS: Savonne has the diagnosis of obesity and her BMI today is 35.7. Dasany is in the action stage of change.   ADVISE: Kameron was educated on the multiple health risks of obesity as well as the benefit of weight loss to improve her health. She was advised of the need for long term treatment and the importance of lifestyle modifications to improve her current health and to decrease her risk of future health problems.  AGREE: Multiple dietary modification options and treatment options were discussed and Michaelann agreed to follow the recommendations documented in the above  note.  ARRANGE: Nadalynn was educated on the  importance of frequent visits to treat obesity as outlined per CMS and USPSTF guidelines and agreed to schedule her next follow up appointment today.  I, Kirke Corin, CMA, am acting as Energy manager for [employer]. Manson Passey, DO  I have reviewed the above documentation for accuracy and completeness, and I agree with the above. -Corinna Capra, DO

## 2018-02-22 ENCOUNTER — Other Ambulatory Visit (INDEPENDENT_AMBULATORY_CARE_PROVIDER_SITE_OTHER): Payer: Self-pay | Admitting: Bariatrics

## 2018-02-22 DIAGNOSIS — E559 Vitamin D deficiency, unspecified: Secondary | ICD-10-CM

## 2018-02-28 DIAGNOSIS — G4733 Obstructive sleep apnea (adult) (pediatric): Secondary | ICD-10-CM | POA: Diagnosis not present

## 2018-03-07 DIAGNOSIS — G4733 Obstructive sleep apnea (adult) (pediatric): Secondary | ICD-10-CM | POA: Diagnosis not present

## 2018-03-08 ENCOUNTER — Ambulatory Visit (INDEPENDENT_AMBULATORY_CARE_PROVIDER_SITE_OTHER): Payer: 59 | Admitting: Bariatrics

## 2018-03-08 VITALS — BP 120/72 | HR 92 | Temp 98.0°F | Ht 63.0 in | Wt 196.0 lb

## 2018-03-08 DIAGNOSIS — I10 Essential (primary) hypertension: Secondary | ICD-10-CM | POA: Diagnosis not present

## 2018-03-08 DIAGNOSIS — E559 Vitamin D deficiency, unspecified: Secondary | ICD-10-CM

## 2018-03-08 DIAGNOSIS — E669 Obesity, unspecified: Secondary | ICD-10-CM

## 2018-03-08 DIAGNOSIS — Z6834 Body mass index (BMI) 34.0-34.9, adult: Secondary | ICD-10-CM

## 2018-03-08 NOTE — Progress Notes (Signed)
Office: [phone]  /  Fax: [phone]   HPI:   Chief Complaint: OBESITY Randa EvensJoanne is here to discuss her progress with her obesity treatment plan. She is on the Category 2 plan and is following her eating plan approximately 92 % of the time. She states she is lifting and doing stairs for 30 minutes 3 times per week. Randa EvensJoanne is doing well. She is not having difficulty and her hunger is appropriate. Her weight is 196 lb (88.9 kg) today and has had a weight loss of 6 pounds over a period of 3 weeks since her last visit. She has lost 16 lbs since starting treatment with us.  Vitamin D deficiency Randa EvensJoanne has a diagnosis of vitamin D deficiency. Her last vitamin D level was at 38.4 on 01/18/18. She is currently taking high dose vit D and denies nausea, vomiting or muscle weakness.  Hypertension Woodroe ModeJoanne Luis is a 63 y.o. female with hypertension. She is currently taking Lisinopril. Woodroe ModeJoanne Terhune denies chest pain. She is working weight loss to help control her blood pressure with the goal of decreasing her risk of heart attack and stroke. [name] blood pressure is well controlled.  ASSESSMENT AND PLAN:  Vitamin D deficiency  Essential hypertension  Class 1 obesity with serious comorbidity and body mass index (BMI) of 34.0 to 34.9 in adult, unspecified obesity type  PLAN:  Vitamin D Deficiency Randa EvensJoanne was informed that low vitamin D levels contributes to fatigue and are associated with obesity, breast, and colon cancer. She agrees to continue to take prescription Vit D @50 ,000 IU every week and will follow up for routine testing of vitamin D, at least 2-3 times per year. She was informed of the risk of over-replacement of vitamin D and agrees to not increase her dose unless she discusses this with us first.  Hypertension We discussed sodium restriction, working on healthy weight loss, and a regular exercise program as the means to achieve improved blood pressure control. Randa EvensJoanne agreed with  this plan and agreed to follow up as directed. We will continue to monitor her blood pressure as well as her progress with the above lifestyle modifications. She will continue her medications as prescribed and will watch for signs of hypotension as she continues her lifestyle modifications.  I spent > than 50% of the 15 minute visit on counseling as documented in the note.  Obesity Randa EvensJoanne is currently in the action stage of change. As such, her goal is to continue with weight loss efforts She has agreed to follow the Category 2 plan Randa EvensJoanne will start on the elliptical after getting back for weight loss and overall health benefits. We discussed the following Behavioral Modification Strategies today: increase H2O intake, no skipping meals, keeping healthy foods in the home, increasing lean protein intake, decreasing simple carbohydrates, increasing vegetables, decrease eating out and work on meal planning and easy cooking plans  Meal plan dinners were provided to patient today.  Randa EvensJoanne has agreed to follow up with our clinic in 2 weeks. She was informed of the importance of frequent follow up visits to maximize her success with intensive lifestyle modifications for her multiple health conditions.  ALLERGIES: Allergies  Allergen Reactions  . Latex   . Sulfa Antibiotics Swelling  . Penicillins Rash    MEDICATIONS: Current Outpatient Medications on File Prior to Visit  Medication Sig Dispense Refill  . acyclovir ointment (ZOVIRAX) 5 % Apply 1 application topically as needed.     . ALPRAZolam (XANAX) 0.5 MG tablet Take  1 tablet (0.5 mg total) by mouth at bedtime as needed for anxiety. 12 tablet 0  . Azelastine HCl 0.15 % SOLN PLACE 2 SPRAYS INTO THE NOSE 2 TIMES DAILY. 30 mL 2  . Cholecalciferol (VITAMIN D3) 5000 units CAPS Take 1 capsule by mouth daily.    . Estradiol (VAGIFEM) 10 MCG TABS vaginal tablet Place 1 tablet vaginally once a week.     . levothyroxine (SYNTHROID, LEVOTHROID) 50 MCG  tablet TAKE 1 TABLET (50 MCG TOTAL) BY MOUTH DAILY BEFORE BREAKFAST. 90 tablet 1  . lisinopril (PRINIVIL) 10 MG tablet Take 1 tablet (10 mg total) by mouth daily. 90 tablet 3  . loratadine (CLARITIN) 10 MG tablet Take 10 mg by mouth daily as needed.     . Multiple Vitamin (MULTIVITAMIN) capsule Take 1 capsule by mouth daily.    . Omega-3 Fatty Acids (FISH OIL) 1200 MG CPDR Take 1,400 mg by mouth.    . Saccharomyces boulardii (PROBIOTIC) 250 MG CAPS Take 500 mg by mouth.    . Turmeric 500 MG CAPS Take by mouth.    . Vitamin D, Ergocalciferol, (DRISDOL) 1.25 MG (50000 UT) CAPS capsule Take 1 capsule (50,000 Units total) by mouth every 7 (seven) days. 4 capsule 0   No current facility-administered medications on file prior to visit.     PAST MEDICAL HISTORY: Past Medical History:  Diagnosis Date  . Allergies   . Anxiety   . Arthritis   . Depression   . Heart valve problem   . Hypertension   . Hypothyroidism   . Palpitations   . Right knee pain   . Sleep apnea   . Swelling of both lower extremities     PAST SURGICAL HISTORY: No past surgical history on file.  SOCIAL HISTORY: Social History   Tobacco Use  . Smoking status: Never Smoker  . Smokeless tobacco: Never Used  Substance Use Topics  . Alcohol use: Yes    Alcohol/week: 6.0 standard drinks    Types: 6 Glasses of wine per week  . Drug use: No    FAMILY HISTORY: Family History  Problem Relation Age of Onset  . Diabetes Mother   . Hypertension Mother   . Dementia Mother   . Thyroid disease Mother   . Depression Mother   . Anxiety disorder Mother   . Obesity Mother   . Colon cancer Maternal Uncle   . Lung cancer Father     ROS: Review of Systems  Constitutional: Positive for weight loss.  Cardiovascular: Negative for chest pain.  Gastrointestinal: Negative for nausea and vomiting.  Musculoskeletal:       Negative for muscle weakness    PHYSICAL EXAM: Blood pressure 120/72, pulse 92, temperature 98 F  (36.7 C), temperature source Oral, height 5\' 3"  (1.6 m), weight 196 lb (88.9 kg), SpO2 99 %. Body mass index is 34.72 kg/m. Physical Exam Vitals signs reviewed.  Constitutional:      Appearance: Normal appearance. She is well-developed. She is obese.  Cardiovascular:     Rate and Rhythm: Normal rate.  Pulmonary:     Effort: Pulmonary effort is normal.  Musculoskeletal: Normal range of motion.  Skin:    General: Skin is warm and dry.  Neurological:     Mental Status: She is alert and oriented to person, place, and time.  Psychiatric:        Mood and Affect: Mood normal.        Behavior: Behavior normal.  RECENT LABS AND TESTS: BMET    Component Value Date/Time   NA 141 01/18/2018 0950   K 4.5 01/18/2018 0950   CL 100 01/18/2018 0950   CO2 24 01/18/2018 0950   GLUCOSE 103 (H) 01/18/2018 0950   GLUCOSE 96 04/19/2017 1030   BUN 13 01/18/2018 0950   CREATININE 0.68 01/18/2018 0950   CALCIUM 10.2 01/18/2018 0950   GFRNONAA 93 01/18/2018 0950   GFRAA 108 01/18/2018 0950   Lab Results  Component Value Date   HGBA1C 5.6 01/18/2018   Lab Results  Component Value Date   INSULIN 25.0 (H) 01/18/2018   CBC    Component Value Date/Time   WBC 5.2 04/19/2017 1030   RBC 4.39 04/19/2017 1030   HGB 14.9 04/19/2017 1030   HCT 42.7 04/19/2017 1030   PLT 244.0 04/19/2017 1030   MCV 97.2 04/19/2017 1030   MCHC 34.9 04/19/2017 1030   RDW 13.1 04/19/2017 1030   LYMPHSABS 2.0 04/19/2017 1030   MONOABS 0.4 04/19/2017 1030   EOSABS 0.1 04/19/2017 1030   BASOSABS 0.0 04/19/2017 1030   Iron/TIBC/Ferritin/ %Sat No results found for: IRON, TIBC, FERRITIN, IRONPCTSAT Lipid Panel     Component Value Date/Time   CHOL 178 06/08/2017 0859   TRIG 142.0 06/08/2017 0859   HDL 62.00 06/08/2017 0859   CHOLHDL 3 06/08/2017 0859   VLDL 28.4 06/08/2017 0859   LDLCALC 88 06/08/2017 0859   Hepatic Function Panel     Component Value Date/Time   PROT 7.3 01/18/2018 0950   ALBUMIN 4.8  01/18/2018 0950   AST 20 01/18/2018 0950   ALT 32 01/18/2018 0950   ALKPHOS 57 01/18/2018 0950   BILITOT 0.5 01/18/2018 0950      Component Value Date/Time   TSH 2.060 01/18/2018 0950   TSH 2.38 04/19/2017 1030   TSH 2.34 10/20/2016 0904   Results for ADDASYN, HELSEL (MRN [id number]) as of 03/08/2018 16:28  Ref. Range 01/18/2018 09:50  Vitamin D, 25-Hydroxy Latest Ref Range: 30.0 - 100.0 ng/mL 38.6     OBESITY BEHAVIORAL INTERVENTION VISIT  Today's visit was # 4   Starting weight: 212 lbs Starting date: 01/18/2018 Today's weight : 196 lbs Today's date: 03/08/2018 Total lbs lost to date: 16    03/08/2018  Height 5\' 3"  (1.6 m)  Weight 196 lb (88.9 kg)  BMI (Calculated) 34.73  BLOOD PRESSURE - SYSTOLIC 120  BLOOD PRESSURE - DIASTOLIC 72   Body Fat % 45.9 %  Total Body Water (lbs) 76.6 lbs    ASK: We discussed the diagnosis of obesity with Woodroe Mode today and Chrystine agreed to give [place] permission to discuss obesity behavioral modification therapy today.  ASSESS: Makinleigh has the diagnosis of obesity and her BMI today is 34.73 Storm is in the action stage of change   ADVISE: Shayanne was educated on the multiple health risks of obesity as well as the benefit of weight loss to improve her health. She was advised of the need for long term treatment and the importance of lifestyle modifications to improve her current health and to decrease her risk of future health problems.  AGREE: Multiple dietary modification options and treatment options were discussed and  Zyniah agreed to follow the recommendations documented in the above note.  ARRANGE: Symantha was educated on the importance of frequent visits to treat obesity as outlined per CMS and USPSTF guidelines and agreed to schedule her next follow up appointment today.  Cristi Loron, am acting as Energy manager for [employer]  Despina Hick, DO  I have reviewed the above documentation for accuracy and completeness, and I agree with the  above. -Corinna Capra, DO

## 2018-03-23 ENCOUNTER — Telehealth (INDEPENDENT_AMBULATORY_CARE_PROVIDER_SITE_OTHER): Payer: Self-pay | Admitting: Bariatrics

## 2018-03-23 NOTE — Telephone Encounter (Signed)
Patient called needing refill on Vitamin D. Patient ask to give her a call in regards to where to send RX. Patient is going out of town & not sure when she will return. [phone].

## 2018-03-24 ENCOUNTER — Other Ambulatory Visit (INDEPENDENT_AMBULATORY_CARE_PROVIDER_SITE_OTHER): Payer: Self-pay

## 2018-03-24 DIAGNOSIS — E559 Vitamin D deficiency, unspecified: Secondary | ICD-10-CM

## 2018-03-24 MED ORDER — VITAMIN D (ERGOCALCIFEROL) 1.25 MG (50000 UNIT) PO CAPS: 50000.0000 [IU] | ORAL_CAPSULE | ORAL | 0 refills | Status: DC

## 2018-03-24 NOTE — Telephone Encounter (Signed)
Prescription was sent in to the pharmacy. Shawnell Dykes, CMA

## 2018-03-29 DIAGNOSIS — G4733 Obstructive sleep apnea (adult) (pediatric): Secondary | ICD-10-CM | POA: Diagnosis not present

## 2018-03-31 ENCOUNTER — Encounter (INDEPENDENT_AMBULATORY_CARE_PROVIDER_SITE_OTHER): Payer: Self-pay

## 2018-03-31 ENCOUNTER — Ambulatory Visit (INDEPENDENT_AMBULATORY_CARE_PROVIDER_SITE_OTHER): Payer: 59 | Admitting: Bariatrics

## 2018-04-05 DIAGNOSIS — G4733 Obstructive sleep apnea (adult) (pediatric): Secondary | ICD-10-CM | POA: Diagnosis not present

## 2018-04-08 ENCOUNTER — Other Ambulatory Visit: Payer: Self-pay | Admitting: Family Medicine

## 2018-04-08 DIAGNOSIS — E039 Hypothyroidism, unspecified: Secondary | ICD-10-CM

## 2018-04-26 ENCOUNTER — Encounter (INDEPENDENT_AMBULATORY_CARE_PROVIDER_SITE_OTHER): Payer: Self-pay | Admitting: Bariatrics

## 2018-04-29 DIAGNOSIS — G4733 Obstructive sleep apnea (adult) (pediatric): Secondary | ICD-10-CM | POA: Diagnosis not present

## 2018-05-29 DIAGNOSIS — G4733 Obstructive sleep apnea (adult) (pediatric): Secondary | ICD-10-CM | POA: Diagnosis not present

## 2018-07-06 ENCOUNTER — Ambulatory Visit (INDEPENDENT_AMBULATORY_CARE_PROVIDER_SITE_OTHER): Payer: BC Managed Care – PPO | Admitting: Family Medicine

## 2018-07-06 ENCOUNTER — Other Ambulatory Visit: Payer: Self-pay

## 2018-07-06 ENCOUNTER — Encounter: Payer: Self-pay | Admitting: Family Medicine

## 2018-07-06 ENCOUNTER — Other Ambulatory Visit (HOSPITAL_COMMUNITY)
Admission: RE | Admit: 2018-07-06 | Discharge: 2018-07-06 | Disposition: A | Discharge status: Home / Self Care | Payer: BC Managed Care – PPO | Source: Ambulatory Visit | Attending: Family Medicine | Admitting: Family Medicine

## 2018-07-06 VITALS — BP 120/82 | HR 91 | Temp 98.5°F | Ht 63.0 in | Wt 196.0 lb

## 2018-07-06 DIAGNOSIS — I10 Essential (primary) hypertension: Secondary | ICD-10-CM

## 2018-07-06 DIAGNOSIS — E559 Vitamin D deficiency, unspecified: Secondary | ICD-10-CM | POA: Diagnosis not present

## 2018-07-06 DIAGNOSIS — E039 Hypothyroidism, unspecified: Secondary | ICD-10-CM | POA: Diagnosis not present

## 2018-07-06 DIAGNOSIS — Z780 Asymptomatic menopausal state: Secondary | ICD-10-CM

## 2018-07-06 DIAGNOSIS — Z124 Encounter for screening for malignant neoplasm of cervix: Secondary | ICD-10-CM

## 2018-07-06 DIAGNOSIS — Z0000 Encounter for general adult medical examination without abnormal findings: Secondary | ICD-10-CM | POA: Diagnosis not present

## 2018-07-06 DIAGNOSIS — E8881 Metabolic syndrome: Secondary | ICD-10-CM | POA: Diagnosis not present

## 2018-07-06 DIAGNOSIS — E88819 Insulin resistance, unspecified: Secondary | ICD-10-CM

## 2018-07-06 DIAGNOSIS — K602 Anal fissure, unspecified: Secondary | ICD-10-CM | POA: Insufficient documentation

## 2018-07-06 DIAGNOSIS — Z13220 Encounter for screening for lipoid disorders: Secondary | ICD-10-CM

## 2018-07-06 LAB — CBC WITH DIFFERENTIAL/PLATELET
Basophils Absolute: 0 10*3/uL (ref 0.0–0.1)
Basophils Relative: 0.7 % (ref 0.0–3.0)
Eosinophils Absolute: 0.1 10*3/uL (ref 0.0–0.7)
Eosinophils Relative: 1 % (ref 0.0–5.0)
HCT: 42.8 % (ref 36.0–46.0)
Hemoglobin: 14.8 g/dL (ref 12.0–15.0)
Lymphocytes Relative: 26.9 % (ref 12.0–46.0)
Lymphs Abs: 1.8 10*3/uL (ref 0.7–4.0)
MCHC: 34.6 g/dL (ref 30.0–36.0)
MCV: 98.2 fl (ref 78.0–100.0)
Monocytes Absolute: 0.5 10*3/uL (ref 0.1–1.0)
Monocytes Relative: 7 % (ref 3.0–12.0)
Neutro Abs: 4.2 10*3/uL (ref 1.4–7.7)
Neutrophils Relative %: 64.4 % (ref 43.0–77.0)
Platelets: 252 10*3/uL (ref 150.0–400.0)
RBC: 4.36 Mil/uL (ref 3.87–5.11)
RDW: 13.7 % (ref 11.5–15.5)
WBC: 6.6 10*3/uL (ref 4.0–10.5)

## 2018-07-06 LAB — COMPREHENSIVE METABOLIC PANEL
ALT: 18 U/L (ref 0–35)
AST: 13 U/L (ref 0–37)
Albumin: 4.5 g/dL (ref 3.5–5.2)
Alkaline Phosphatase: 51 U/L (ref 39–117)
BUN: 17 mg/dL (ref 6–23)
CO2: 27 mEq/L (ref 19–32)
Calcium: 9.9 mg/dL (ref 8.4–10.5)
Chloride: 103 mEq/L (ref 96–112)
Creatinine, Ser: 0.63 mg/dL (ref 0.40–1.20)
GFR: 95.03 mL/min (ref >60.00)
Glucose, Bld: 93 mg/dL (ref 70–99)
Potassium: 4.5 mEq/L (ref 3.5–5.1)
Sodium: 140 mEq/L (ref 135–145)
Total Bilirubin: 0.5 mg/dL (ref 0.2–1.2)
Total Protein: 7 g/dL (ref 6.0–8.3)

## 2018-07-06 LAB — T4, FREE: Free T4: 0.8 ng/dL (ref 0.60–1.60)

## 2018-07-06 LAB — TSH: TSH: 2.34 u[IU]/mL (ref 0.35–4.50)

## 2018-07-06 LAB — VITAMIN D 25 HYDROXY (VIT D DEFICIENCY, FRACTURES): VITD: 34.54 ng/mL (ref 30.00–100.00)

## 2018-07-06 MED ORDER — DILTIAZEM GEL 2 %: 1.0000 "application " | Freq: Three times a day (TID) | CUTANEOUS | 3 refills | Status: AC

## 2018-07-06 MED ORDER — LISINOPRIL 10 MG PO TABS: 10.0000 mg | ORAL_TABLET | Freq: Every day | ORAL | 3 refills | Status: DC

## 2018-07-06 MED ORDER — LEVOTHYROXINE SODIUM 50 MCG PO TABS: 50.0000 ug | ORAL_TABLET | Freq: Every day | ORAL | 0 refills | Status: DC

## 2018-07-06 MED ORDER — DILTIAZEM GEL 2 %: 1.0000 "application " | Freq: Three times a day (TID) | CUTANEOUS | 3 refills | Status: DC

## 2018-07-06 MED ORDER — ESTRADIOL 10 MCG VA TABS: 1.0000 | ORAL_TABLET | VAGINAL | 1 refills | Status: AC

## 2018-07-06 MED ORDER — ACYCLOVIR 5 % EX OINT: 1.0000 "application " | TOPICAL_OINTMENT | CUTANEOUS | 1 refills | Status: AC | PRN

## 2018-07-06 NOTE — Progress Notes (Signed)
Subjective:    Kimberly Mcguire is a 63 y.o. female and is here for a comprehensive physical exam.  There are no preventive care reminders to display for this patient.   Current Outpatient Medications:  .  acyclovir ointment (ZOVIRAX) 5 %, Apply 1 application topically as needed., Disp: 15 g, Rfl: 1 .  Estradiol (VAGIFEM) 10 MCG TABS vaginal tablet, Place 1 tablet (10 mcg total) vaginally once a week., Disp: 8 tablet, Rfl: 1 .  levothyroxine (SYNTHROID) 50 MCG tablet, Take 1 tablet (50 mcg total) by mouth daily before breakfast., Disp: 90 tablet, Rfl: 0 .  lisinopril (PRINIVIL) 10 MG tablet, Take 1 tablet (10 mg total) by mouth daily., Disp: 90 tablet, Rfl: 3 .  Multiple Vitamin (MULTIVITAMIN) capsule, Take 1 capsule by mouth daily., Disp: , Rfl:  .  Omega-3 Fatty Acids (FISH OIL) 1200 MG CPDR, Take 1,400 mg by mouth., Disp: , Rfl:  .  Saccharomyces boulardii (PROBIOTIC) 250 MG CAPS, Take 500 mg by mouth., Disp: , Rfl:  .  Turmeric 500 MG CAPS, Take by mouth., Disp: , Rfl:  .  Vitamin D, Ergocalciferol, (DRISDOL) 1.25 MG (50000 UT) CAPS capsule, Take 1 capsule (50,000 Units total) by mouth every 7 (seven) days., Disp: 4 capsule, Rfl: 0 .  diltiazem 2 % GEL, Apply 1 application topically 3 (three) times daily., Disp: 30 g, Rfl: 3  PMHx, SurgHx, SocialHx, Medications, and Allergies were reviewed in the Visit Navigator and updated as appropriate.   Past Medical History:  Diagnosis Date  . Allergies   . Anxiety   . Arthritis   . Depression   . Heart valve problem   . Hypertension   . Hypothyroidism   . Palpitations   . Right knee pain   . Sleep apnea   . Swelling of both lower extremities    History reviewed. No pertinent surgical history.   Family History  Problem Relation Age of Onset  . Diabetes Mother   . Hypertension Mother   . Dementia Mother   . Thyroid disease Mother   . Depression Mother   . Anxiety disorder Mother   . Obesity Mother   . Colon cancer Maternal Uncle    . Lung cancer Father    Social History   Tobacco Use  . Smoking status: Never Smoker  . Smokeless tobacco: Never Used  Substance Use Topics  . Alcohol use: Yes    Alcohol/week: 6.0 standard drinks    Types: 6 Glasses of wine per week  . Drug use: No   Review of Systems:   Pertinent items are noted in the HPI. Otherwise, ROS is negative.  Objective:   BP 120/82 (BP Location: Left Arm, Patient Position: Sitting, Cuff Size: Normal)   Pulse 91   Temp 98.5 F (36.9 C) (Oral)   Ht 5\' 3"  (1.6 m)   Wt 196 lb (88.9 kg)   SpO2 96%   BMI 34.72 kg/m   General appearance: alert, cooperative and appears stated age. Head: normocephalic, without obvious abnormality, atraumatic. Neck: no adenopathy, supple, symmetrical, trachea midline; thyroid not enlarged, symmetric, no tenderness/mass/nodules. Lungs: clear to auscultation bilaterally. Heart: regular rate and rhythm Abdomen: soft, non-tender; no masses,  no organomegaly. Extremities: extremities normal, atraumatic, no cyanosis or edema. Skin: skin color, texture, turgor normal, no rashes or lesions. Lymph: cervical, supraclavicular, and axillary nodes normal; no abnormal inguinal nodes palpated. Neurologic: grossly normal.  Pelvic:  External genitalia: no lesions. Urethra: normal appearing urethra with no masses, tenderness or  lesions. Bartholin's and Skene's: normal. Vagina: normal appearing vagina with normal color and discharge, no lesions. Cervix: normal appearance. Pap and high risk HPV testing done: Yes.   Uterus: uterus is normal size, shape, consistency and nontender. Adnexa: normal adnexa in size, nontender and no masses.                                      Assessment/Plan:   Kimberly Mcguire was seen today for annual exam.  Diagnoses and all orders for this visit:  Routine physical examination  Insulin resistance -     CBC with Differential/Platelet -     Comprehensive metabolic panel  Acquired hypothyroidism -     CBC  with Differential/Platelet -     Comprehensive metabolic panel -     TSH -     T4, free -     Discontinue: levothyroxine (SYNTHROID) 50 MCG tablet; Take 1 tablet (50 mcg total) by mouth daily before breakfast. -     levothyroxine (SYNTHROID) 50 MCG tablet; Take 1 tablet (50 mcg total) by mouth daily before breakfast.  Essential hypertension -     lisinopril (PRINIVIL) 10 MG tablet; Take 1 tablet (10 mg total) by mouth daily.  Postmenopausal -     Estradiol (VAGIFEM) 10 MCG TABS vaginal tablet; Place 1 tablet (10 mcg total) vaginally once a week.  Vitamin D deficiency -     VITAMIN D 25 Hydroxy (Vit-D Deficiency, Fractures)  Screening for lipid disorders  Screening for cervical cancer -     Cytology - PAP  Anal fissure -     Discontinue: diltiazem 2 % GEL; Apply 1 application topically 3 (three) times daily. -     Discontinue: diltiazem 2 % GEL; Apply 1 application topically 3 (three) times daily. -     diltiazem 2 % GEL; Apply 1 application topically 3 (three) times daily.  Other orders -     acyclovir ointment (ZOVIRAX) 5 %; Apply 1 application topically as needed.   Patient Counseling: [x]    Nutrition: Stressed importance of moderation in sodium/caffeine intake, saturated fat and cholesterol, caloric balance, sufficient intake of fresh fruits, vegetables, fiber, calcium, iron, and 1 mg of folate supplement per day (for females capable of pregnancy).  [x]    Stressed the importance of regular exercise.   [x]    Substance Abuse: Discussed cessation/primary prevention of tobacco, alcohol, or other drug use; driving or other dangerous activities under the influence; availability of treatment for abuse.   [x]    Injury prevention: Discussed safety belts, safety helmets, smoke detector, smoking near bedding or upholstery.   [x]    Sexuality: Discussed sexually transmitted diseases, partner selection, use of condoms, avoidance of unintended pregnancy  and contraceptive alternatives.  [x]     Dental health: Discussed importance of regular tooth brushing, flossing, and dental visits.  [x]    Health maintenance and immunizations reviewed. Please refer to Health maintenance section.   Briscoe Deutscher, DO [place]

## 2018-07-07 ENCOUNTER — Other Ambulatory Visit: Payer: Self-pay | Admitting: Family Medicine

## 2018-07-07 DIAGNOSIS — E039 Hypothyroidism, unspecified: Secondary | ICD-10-CM

## 2018-07-07 LAB — CYTOLOGY - PAP
Diagnosis: NEGATIVE
HPV: NOT DETECTED

## 2018-07-07 NOTE — Telephone Encounter (Signed)
Rx request 

## 2018-07-10 ENCOUNTER — Encounter: Payer: Self-pay | Admitting: Family Medicine

## 2018-07-11 ENCOUNTER — Other Ambulatory Visit: Payer: Self-pay

## 2018-07-11 ENCOUNTER — Other Ambulatory Visit: Payer: Self-pay | Admitting: Family Medicine

## 2018-07-11 DIAGNOSIS — I10 Essential (primary) hypertension: Secondary | ICD-10-CM

## 2018-07-11 DIAGNOSIS — E559 Vitamin D deficiency, unspecified: Secondary | ICD-10-CM

## 2018-07-11 MED ORDER — VITAMIN D (ERGOCALCIFEROL) 1.25 MG (50000 UNIT) PO CAPS: 50000.0000 [IU] | ORAL_CAPSULE | ORAL | 0 refills | Status: DC

## 2018-07-20 ENCOUNTER — Encounter: Payer: Self-pay | Admitting: Family Medicine

## 2018-07-22 ENCOUNTER — Other Ambulatory Visit: Payer: Self-pay | Admitting: Family Medicine

## 2018-07-22 DIAGNOSIS — E559 Vitamin D deficiency, unspecified: Secondary | ICD-10-CM

## 2018-08-01 ENCOUNTER — Other Ambulatory Visit: Payer: Self-pay | Admitting: Family Medicine

## 2018-08-01 DIAGNOSIS — E559 Vitamin D deficiency, unspecified: Secondary | ICD-10-CM

## 2018-11-19 ENCOUNTER — Other Ambulatory Visit: Payer: Self-pay | Admitting: Family Medicine

## 2018-11-19 DIAGNOSIS — I10 Essential (primary) hypertension: Secondary | ICD-10-CM

## 2018-11-19 DIAGNOSIS — E039 Hypothyroidism, unspecified: Secondary | ICD-10-CM

## 2018-12-19 ENCOUNTER — Ambulatory Visit: Payer: 59 | Admitting: Neurology

## 2019-03-07 ENCOUNTER — Other Ambulatory Visit: Payer: Self-pay | Admitting: Family Medicine

## 2019-03-07 DIAGNOSIS — I10 Essential (primary) hypertension: Secondary | ICD-10-CM

## 2019-03-07 DIAGNOSIS — E039 Hypothyroidism, unspecified: Secondary | ICD-10-CM

## 2019-03-07 NOTE — Telephone Encounter (Signed)
Patient last here in July with Dr. Earlene Plater.  Needs to set up with new provider.  Please call and let her know needs TOC with someone.  I've refilled her medications for 3 months only. Will need an appt with someone prior to next refill. Thanks.

## 2019-03-07 NOTE — Telephone Encounter (Signed)
Called and LVM to schedule TOC appt 

## 2020-03-26 IMAGING — MG DIGITAL SCREENING BILATERAL MAMMOGRAM WITH TOMO AND CAD
6 of 10 series · 6 of 30 positions shown · non-contrast
Comparison: Previous exam(s).

CLINICAL DATA: Screening.

EXAM:
DIGITAL SCREENING BILATERAL MAMMOGRAM WITH TOMO AND CAD

[R CC synth-2D]
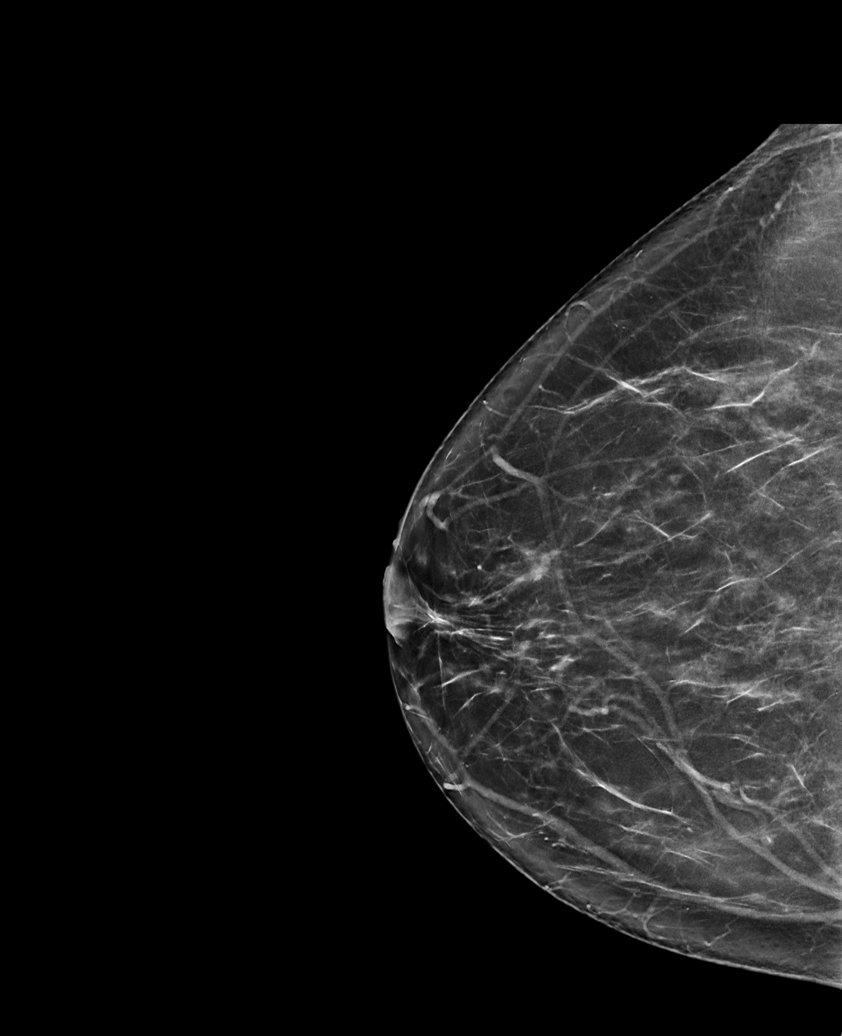

[L CC synth-2D]
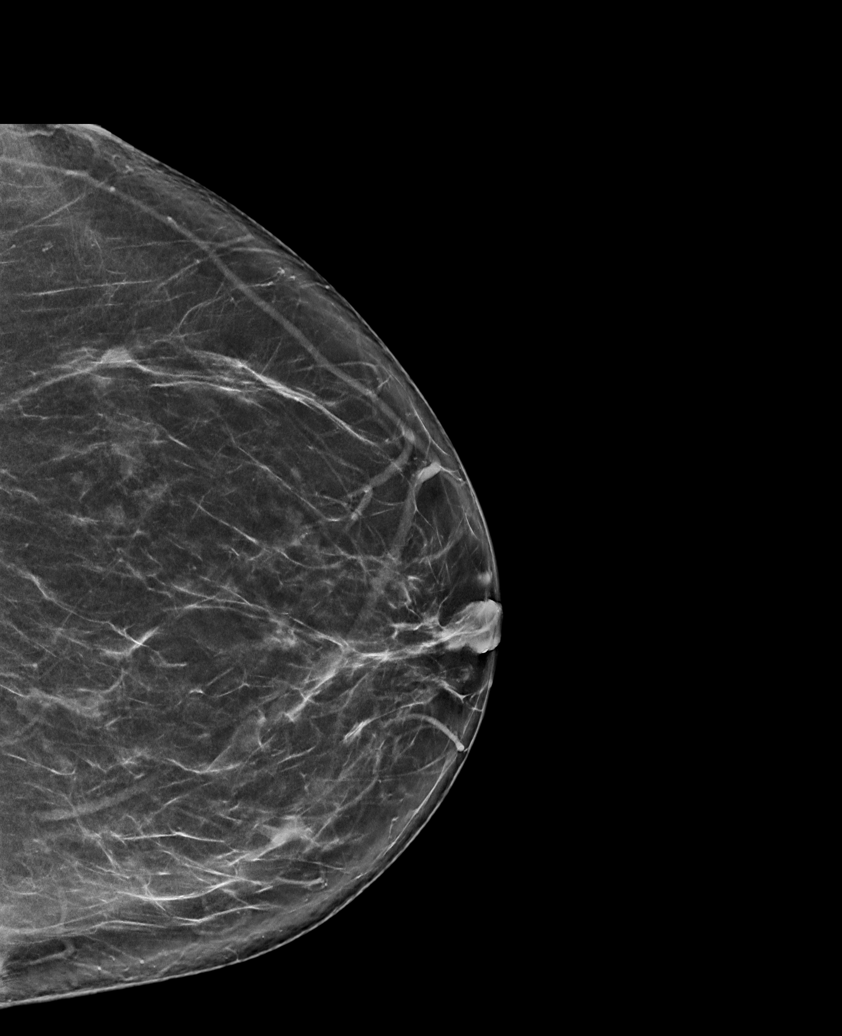

[L MLO synth-2D]
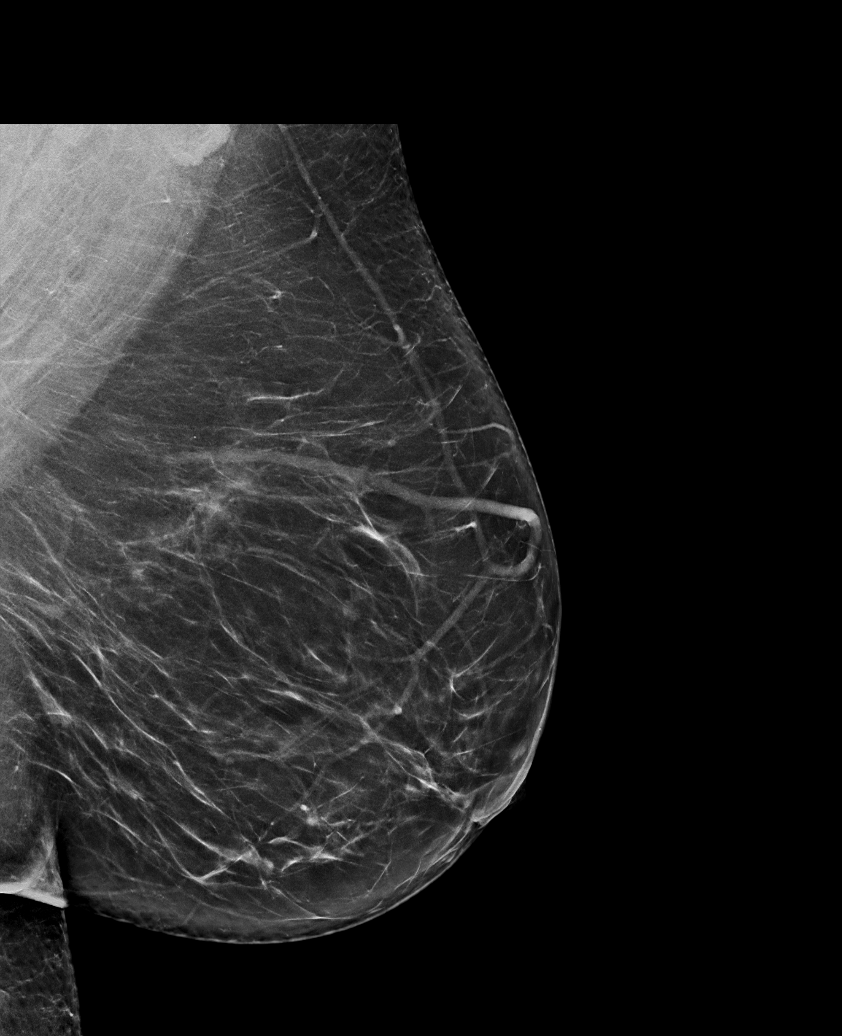

[R MLO synth-2D (1 of 2)]
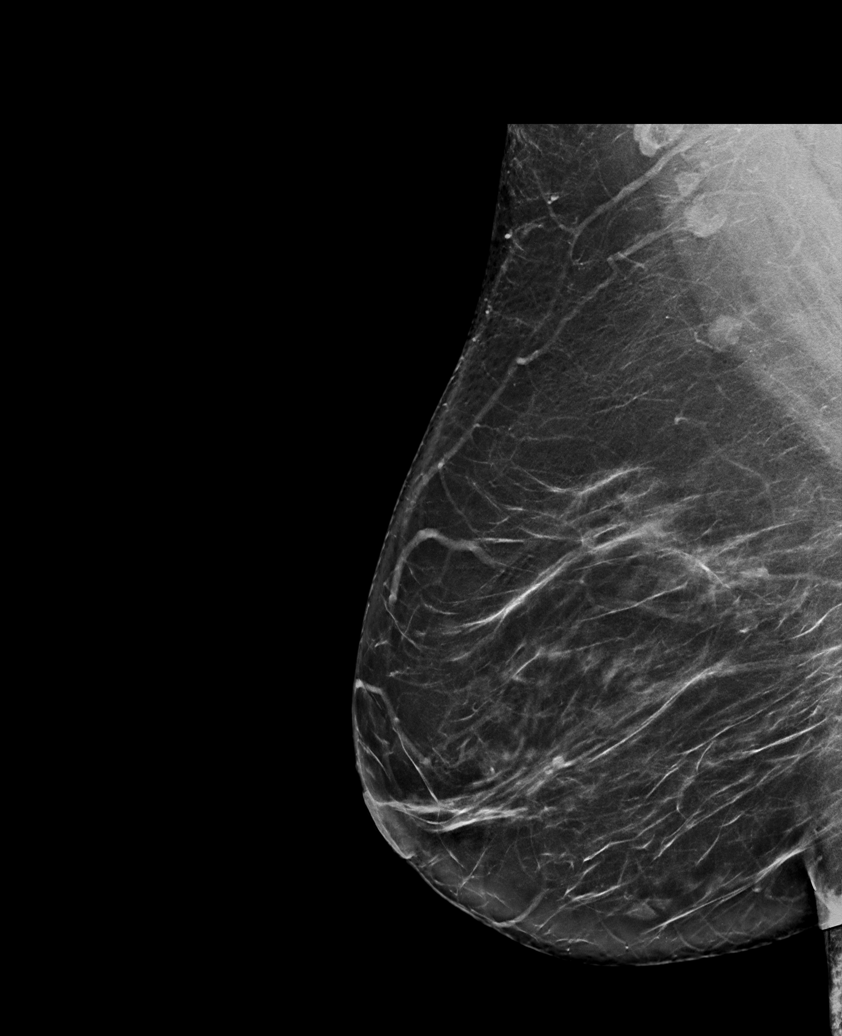

[R MLO synth-2D (2 of 2)]
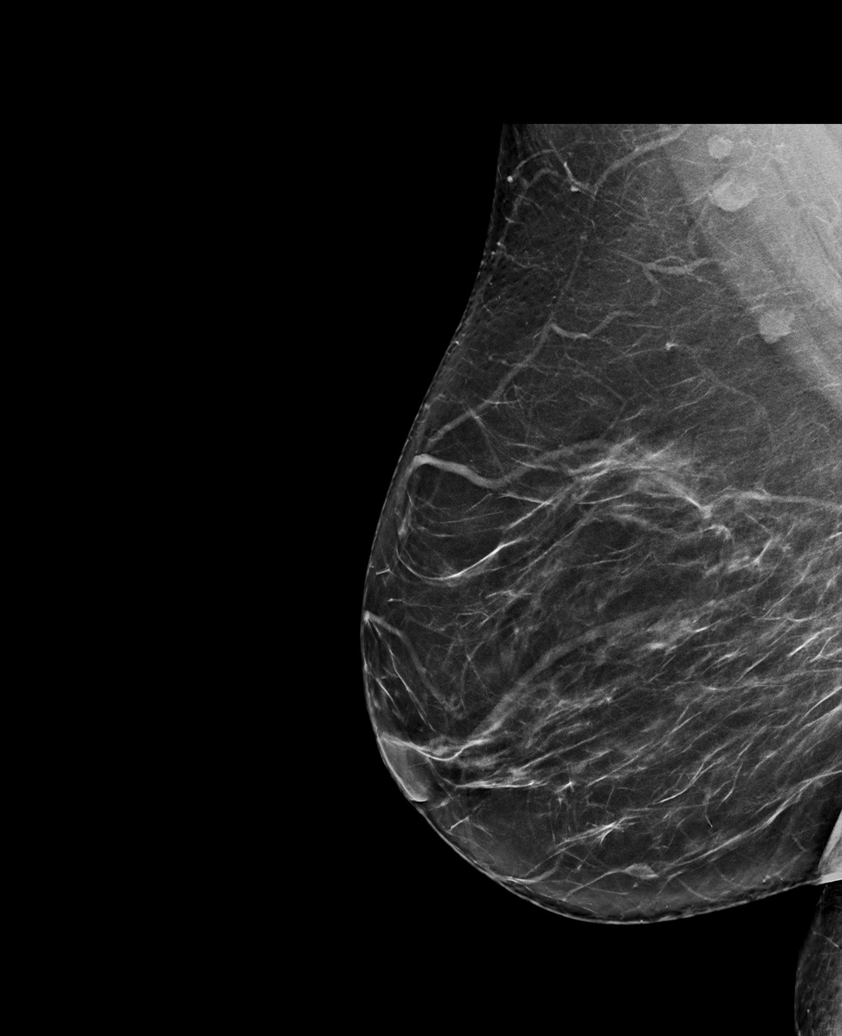

[R MLO tomo · tomo slice 41/82.0]
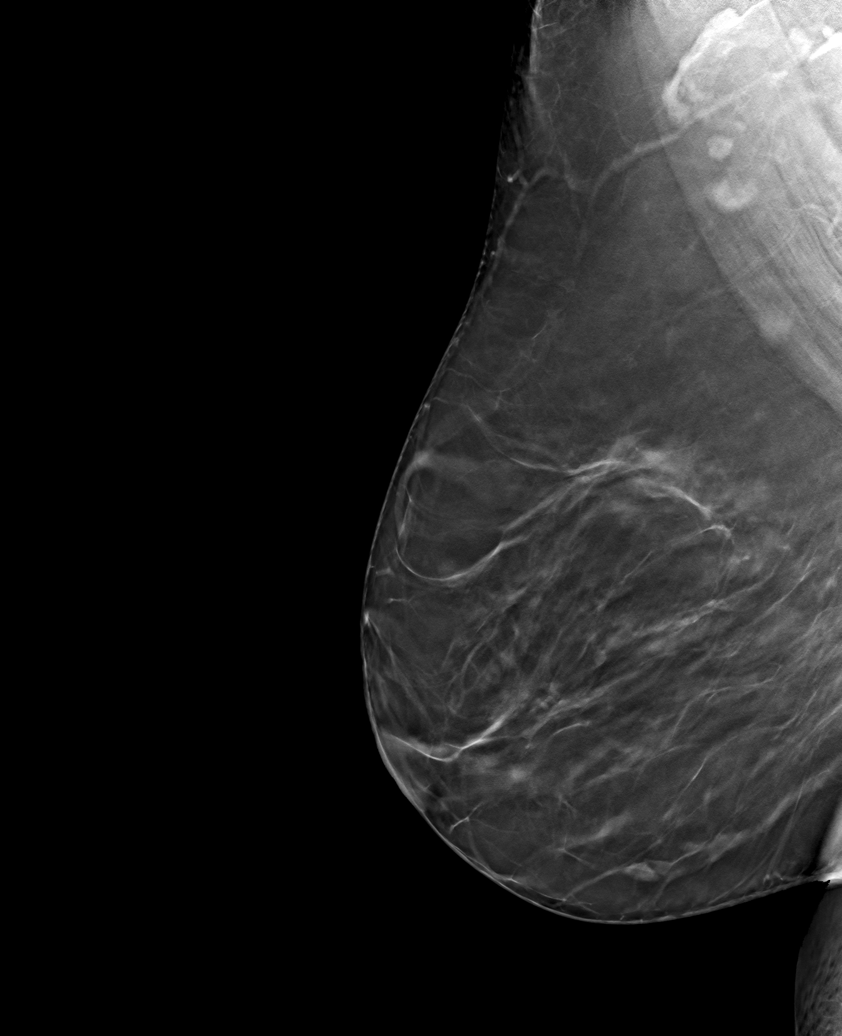

[6 of 30 positions shown; findings below may reference images not displayed]

ACR Breast Density Category b: There are scattered areas of
fibroglandular density.
FINDINGS: There are no findings suspicious for malignancy. Images were
processed with CAD.
IMPRESSION: No mammographic evidence of malignancy. A result letter of this
screening mammogram will be mailed directly to the patient.

RECOMMENDATION:
Screening mammogram in one year. (Code:[id number])

BI-RADS CATEGORY  1: Negative.
